# Patient Record
Sex: Female | Born: 1975 | Race: White | Hispanic: No | Marital: Single | Smoking: Former smoker
Health system: Southern US, Community
[De-identification: ages and names within clinical notes are randomized; demographics above are authoritative.]

## PROBLEM LIST (undated history)

## (undated) DIAGNOSIS — E119 Type 2 diabetes mellitus without complications: Secondary | ICD-10-CM

## (undated) DIAGNOSIS — I10 Essential (primary) hypertension: Secondary | ICD-10-CM

## (undated) DIAGNOSIS — Z8719 Personal history of other diseases of the digestive system: Secondary | ICD-10-CM

## (undated) HISTORY — PX: COLONOSCOPY: SHX174

---

## 2010-12-25 ENCOUNTER — Ambulatory Visit: Payer: Self-pay

## 2014-09-20 ENCOUNTER — Other Ambulatory Visit: Payer: Self-pay

## 2014-09-20 ENCOUNTER — Encounter: Payer: Self-pay | Admitting: General Practice

## 2014-09-20 ENCOUNTER — Emergency Department
Admission: EM | Admit: 2014-09-20 | Discharge: 2014-09-20 | Disposition: A | Discharge status: Home / Self Care | Payer: Self-pay | Attending: Emergency Medicine | Admitting: Emergency Medicine

## 2014-09-20 ENCOUNTER — Ambulatory Visit
Admission: EM | Admit: 2014-09-20 | Discharge: 2014-09-20 | Payer: Self-pay | Attending: Internal Medicine | Admitting: Internal Medicine

## 2014-09-20 DIAGNOSIS — N764 Abscess of vulva: Secondary | ICD-10-CM | POA: Insufficient documentation

## 2014-09-20 DIAGNOSIS — R509 Fever, unspecified: Secondary | ICD-10-CM | POA: Insufficient documentation

## 2014-09-20 DIAGNOSIS — Z79899 Other long term (current) drug therapy: Secondary | ICD-10-CM | POA: Insufficient documentation

## 2014-09-20 DIAGNOSIS — L0291 Cutaneous abscess, unspecified: Secondary | ICD-10-CM

## 2014-09-20 DIAGNOSIS — N762 Acute vulvitis: Secondary | ICD-10-CM | POA: Insufficient documentation

## 2014-09-20 DIAGNOSIS — I10 Essential (primary) hypertension: Secondary | ICD-10-CM

## 2014-09-20 DIAGNOSIS — L0390 Cellulitis, unspecified: Secondary | ICD-10-CM

## 2014-09-20 DIAGNOSIS — Z87891 Personal history of nicotine dependence: Secondary | ICD-10-CM | POA: Insufficient documentation

## 2014-09-20 DIAGNOSIS — Z3202 Encounter for pregnancy test, result negative: Secondary | ICD-10-CM | POA: Insufficient documentation

## 2014-09-20 DIAGNOSIS — E1165 Type 2 diabetes mellitus with hyperglycemia: Secondary | ICD-10-CM

## 2014-09-20 LAB — CBC WITH DIFFERENTIAL/PLATELET
Basophils Absolute: 0.1 10*3/uL (ref 0–0.1)
Basophils Relative: 0 %
EOS ABS: 0.1 10*3/uL (ref 0–0.7)
Eosinophils Relative: 1 %
HCT: 46.9 % (ref 35.0–47.0)
Hemoglobin: 15.3 g/dL (ref 12.0–16.0)
Lymphocytes Relative: 11 %
Lymphs Abs: 1.7 10*3/uL (ref 1.0–3.6)
MCH: 30 pg (ref 26.0–34.0)
MCHC: 32.7 g/dL (ref 32.0–36.0)
MCV: 91.7 fL (ref 80.0–100.0)
Monocytes Absolute: 1 10*3/uL — ABNORMAL HIGH (ref 0.2–0.9)
Monocytes Relative: 6 %
NEUTROS ABS: 13.6 10*3/uL — AB (ref 1.4–6.5)
NEUTROS PCT: 82 %
Platelets: 287 10*3/uL (ref 150–440)
RBC: 5.12 MIL/uL (ref 3.80–5.20)
RDW: 12.5 % (ref 11.5–14.5)
WBC: 16.5 10*3/uL — ABNORMAL HIGH (ref 3.6–11.0)

## 2014-09-20 LAB — COMPREHENSIVE METABOLIC PANEL
ALK PHOS: 106 U/L (ref 38–126)
ALT: 31 U/L (ref 14–54)
AST: 20 U/L (ref 15–41)
Albumin: 4.2 g/dL (ref 3.5–5.0)
Anion gap: 11 (ref 5–15)
BUN: 10 mg/dL (ref 6–20)
CO2: 24 mmol/L (ref 22–32)
Calcium: 9 mg/dL (ref 8.9–10.3)
Chloride: 100 mmol/L — ABNORMAL LOW (ref 101–111)
Creatinine, Ser: 0.64 mg/dL (ref 0.44–1.00)
GFR calc Af Amer: >60 mL/min (ref >60)
GLUCOSE: 290 mg/dL — AB (ref 65–99)
Potassium: 3.4 mmol/L — ABNORMAL LOW (ref 3.5–5.1)
Sodium: 135 mmol/L (ref 135–145)
Total Bilirubin: 4.3 mg/dL — ABNORMAL HIGH (ref 0.3–1.2)
Total Protein: 7.8 g/dL (ref 6.5–8.1)

## 2014-09-20 LAB — URINALYSIS COMPLETE WITH MICROSCOPIC (ARMC ONLY)
Bilirubin Urine: NEGATIVE
Glucose, UA: >500 mg/dL — AB
Leukocytes, UA: NEGATIVE
NITRITE: NEGATIVE
Protein, ur: 100 mg/dL — AB
Specific Gravity, Urine: 1.035 — ABNORMAL HIGH (ref 1.005–1.030)
pH: 5 (ref 5.0–8.0)

## 2014-09-20 LAB — POCT PREGNANCY, URINE: PREG TEST UR: NEGATIVE

## 2014-09-20 LAB — LACTIC ACID, PLASMA: Lactic Acid, Venous: 1.6 mmol/L (ref 0.5–2.0)

## 2014-09-20 LAB — GLUCOSE, CAPILLARY: Glucose-Capillary: 219 mg/dL — ABNORMAL HIGH (ref 70–99)

## 2014-09-20 MED ORDER — CEPHALEXIN 500 MG PO CAPS
500.0000 mg | ORAL_CAPSULE | Freq: Once | ORAL | Status: AC
  Administered 2014-09-20: 500 mg via ORAL

## 2014-09-20 MED ORDER — CEPHALEXIN 500 MG PO CAPS
ORAL_CAPSULE | ORAL | Status: AC
  Filled 2014-09-20: qty 1

## 2014-09-20 MED ORDER — DOXYCYCLINE HYCLATE 50 MG PO CAPS: 100.0000 mg | ORAL_CAPSULE | Freq: Two times a day (BID) | ORAL | Status: DC

## 2014-09-20 MED ORDER — KETOROLAC TROMETHAMINE 60 MG/2ML IM SOLN
60.0000 mg | Freq: Once | INTRAMUSCULAR | Status: AC
  Administered 2014-09-20: 60 mg via INTRAMUSCULAR

## 2014-09-20 MED ORDER — METFORMIN HCL 500 MG PO TABS: 500.0000 mg | ORAL_TABLET | Freq: Two times a day (BID) | ORAL | Status: AC

## 2014-09-20 MED ORDER — DOXYCYCLINE HYCLATE 100 MG PO TABS
100.0000 mg | ORAL_TABLET | Freq: Once | ORAL | Status: AC
  Administered 2014-09-20: 100 mg via ORAL
  Filled 2014-09-20: qty 1

## 2014-09-20 MED ORDER — METFORMIN HCL 500 MG PO TABS: 500.0000 mg | ORAL_TABLET | Freq: Once | ORAL | Status: DC

## 2014-09-20 NOTE — ED Notes (Signed)
Pt discharged home after verbalizing understanding of discharge instructions; nad noted. 

## 2014-09-20 NOTE — ED Notes (Signed)
States stared Wednesday night with abscess left outer labia. Yesterday had sister attempt to lance using a needle. States "a lot of yellow stuff came out". Increased pain today and continues to drain bloody yellow discharge

## 2014-09-20 NOTE — Discharge Instructions (Signed)
As we discussed - please report directly to [facility] Room for evaluation and treatment  Abscess An abscess is an infected area that contains a collection of pus and debris.It can occur in almost any part of the body. An abscess is also known as a furuncle or boil. CAUSES  An abscess occurs when tissue gets infected. This can occur from blockage of oil or sweat glands, infection of hair follicles, or a minor injury to the skin. As the body tries to fight the infection, pus collects in the area and creates pressure under the skin. This pressure causes pain. People with weakened immune systems have difficulty fighting infections and get certain abscesses more often.  SYMPTOMS Usually an abscess develops on the skin and becomes a painful mass that is red, warm, and tender. If the abscess forms under the skin, you may feel a moveable soft area under the skin. Some abscesses break open (rupture) on their own, but most will continue to get worse without care. The infection can spread deeper into the body and eventually into the bloodstream, causing you to feel ill.  DIAGNOSIS  Your caregiver will take your medical history and perform a physical exam. A sample of fluid may also be taken from the abscess to determine what is causing your infection. TREATMENT  Your caregiver may prescribe antibiotic medicines to fight the infection. However, taking antibiotics alone usually does not cure an abscess. Your caregiver may need to make a small cut (incision) in the abscess to drain the pus. In some cases, gauze is packed into the abscess to reduce pain and to continue draining the area. HOME CARE INSTRUCTIONS   Only take over-the-counter or prescription medicines for pain, discomfort, or fever as directed by your caregiver.  If you were prescribed antibiotics, take them as directed. Finish them even if you start to feel better.  If gauze is used, follow your caregiver's directions for changing the  gauze.  To avoid spreading the infection:  Keep your draining abscess covered with a bandage.  Wash your hands well.  Do not share personal care items, towels, or whirlpools with others.  Avoid skin contact with others.  Keep your skin and clothes clean around the abscess.  Keep all follow-up appointments as directed by your caregiver. SEEK MEDICAL CARE IF:   You have increased pain, swelling, redness, fluid drainage, or bleeding.  You have muscle aches, chills, or a general ill feeling.  You have a fever. MAKE SURE YOU:   Understand these instructions.  Will watch your condition.  Will get help right away if you are not doing well or get worse. Document Released: 02/08/2005 Document Revised: 10/31/2011 Document Reviewed: 07/14/2011 [facility] Patient Information 2015 [place], [place]. This information is not intended to replace advice given to you by your health care provider. Make sure you discuss any questions you have with your health care provider.  Incision and Drainage Incision and drainage is a procedure in which a sac-like structure (cystic structure) is opened and drained. The area to be drained usually contains material such as pus, fluid, or blood.  LET YOUR CAREGIVER KNOW ABOUT:   Allergies to medicine.  Medicines taken, including vitamins, herbs, eyedrops, over-the-counter medicines, and creams.  Use of steroids (by mouth or creams).  Previous problems with anesthetics or numbing medicines.  History of bleeding problems or blood clots.  Previous surgery.  Other health problems, including diabetes and kidney problems.  Possibility of pregnancy, if this applies. RISKS AND COMPLICATIONS  Pain.  Bleeding.  Scarring.  Infection. BEFORE THE PROCEDURE  You may need to have an ultrasound or other imaging tests to see how large or deep your cystic structure is. Blood tests may also be used to determine if you have an infection or how severe the infection  is. You may need to have a tetanus shot. PROCEDURE  The affected area is cleaned with a cleaning fluid. The cyst area will then be numbed with a medicine (local anesthetic). A small incision will be made in the cystic structure. A syringe or catheter may be used to drain the contents of the cystic structure, or the contents may be squeezed out. The area will then be flushed with a cleansing solution. After cleansing the area, it is often gently packed with a gauze or another wound dressing. Once it is packed, it will be covered with gauze and tape or some other type of wound dressing. AFTER THE PROCEDURE   Often, you will be allowed to go home right after the procedure.  You may be given antibiotic medicine to prevent or heal an infection.  If the area was packed with gauze or some other wound dressing, you will likely need to come back in 1 to 2 days to get it removed.  The area should heal in about 14 days. Document Released: 10/25/2000 Document Revised: 10/31/2011 Document Reviewed: 06/26/2011 [facility] Patient Information 2015 [place], [place]. This information is not intended to replace advice given to you by your health care provider. Make sure you discuss any questions you have with your health care provider.

## 2014-09-20 NOTE — Discharge Instructions (Signed)

## 2014-09-20 NOTE — ED Notes (Signed)
Pt. Arrived to ed from urgent care. Pt was sent for a evaluation of abscess. Pt reports abscess to left labia that appeared on Wednesday. Pt vebalized experiencing a "yellow discharge".  Pt verbalized she has been experiencing fever over the last few days but denies fever today. Alert and Oriented.

## 2014-09-20 NOTE — ED Provider Notes (Signed)
[facility] Emergency Department Provider Note  ____________________________________________  Time seen: Approximately 1200 PM  I have reviewed the triage vital signs and the nursing notes.   HISTORY  Chief Complaint Abscess    HPI Tabitha Maynard is a 39 y.o. female without any medical problems who presents today with 5 days of left labial swelling and pain. The patient says that the area had been draining for the last 3 days with a large amount of yellow drainage. She also notes a fever yesterday but none today. She was seen in urgent care and sent to the emergency department for possible incision and drainage. She says that she has no known history of high blood pressure diabetes but has not been seen by a primary care doctor in 5 years. He is moderate and worsened by walking or sitting. She has no concern for sexually transmitted diseases.   History reviewed. No pertinent past medical history.  There are no active problems to display for this patient.   No past surgical history on file.  Current Outpatient Rx  Name  Route  Sig  Dispense  Refill  . loratadine (CLARITIN) 10 MG tablet   Oral   Take 10 mg by mouth daily.           Allergies Sulfa antibiotics  Family History  Problem Relation Age of Onset  . Heart attack Father     Social History History  Substance Use Topics  . Smoking status: Former Smoker    Types: Cigarettes  . Smokeless tobacco: Not on file  . Alcohol Use: No    Review of Systems Constitutional: Positive for fever  Eyes: No visual changes. ENT: No sore throat. Cardiovascular: Denies chest pain. Respiratory: Denies shortness of breath. Gastrointestinal: No abdominal pain.  No nausea, no vomiting.  No diarrhea.  No constipation. Genitourinary: Negative for dysuria. Musculoskeletal: Negative for back pain. Skin: Left labial swelling Neurological: Negative for headaches, focal weakness or numbness.  10-point  ROS otherwise negative.  ____________________________________________   PHYSICAL EXAM:  VITAL SIGNS: ED Triage Vitals  Enc Vitals Group     BP 09/20/14 1034 172/106 mmHg     Pulse Rate 09/20/14 1034 109     Resp 09/20/14 1034 19     Temp 09/20/14 1034 98.2 F (36.8 C)     Temp Source 09/20/14 1034 Oral     SpO2 09/20/14 1034 100 %     Weight 09/20/14 1032 262 lb (118.842 kg)     Height 09/20/14 1032 5\' 2"  (1.575 m)     Head Cir --      Peak Flow --      Pain Score 09/20/14 1032 7     Pain Loc --      Pain Edu? --      Excl. in GC? --     Constitutional: Alert and oriented. Well appearing and in no acute distress. Eyes: Conjunctivae are normal. PERRL. EOMI. Head: Atraumatic. Nose: No congestion/rhinnorhea. Mouth/Throat: Mucous membranes are moist.  Oropharynx non-erythematous. Neck: No stridor.   Cardiovascular: Tachycardic, regular rhythm. Grossly normal heart sounds.  Good peripheral circulation. Respiratory: Normal respiratory effort.  No retractions. Lungs CTAB. Gastrointestinal: Soft and nontender. No distention. No abdominal bruits. No CVA tenderness. Genitourinary:  Left labia swollen with erythema. There is a mild-to-moderate amount of induration throughout the labia. About two thirds posterior on the labia there is a 1 x 2 cm opening without any drainage. No pus could be expressed. There is  no fluctuance. Mild to moderate tenderness to palpation on the exam.  Musculoskeletal: No lower extremity tenderness nor edema.  No joint effusions. Neurologic:  Normal speech and language. No gross focal neurologic deficits are appreciated. Speech is normal. No gait instability. Skin:  Skin is warm, dry and intact. No rash noted. Psychiatric: Mood and affect are normal. Speech and behavior are normal.  ____________________________________________   LABS (all labs ordered are listed, but only abnormal results are displayed)  Labs Reviewed  COMPREHENSIVE METABOLIC PANEL -  Abnormal; Notable for the following:    Potassium 3.4 (*)    Chloride 100 (*)    Glucose, Bld 290 (*)    Total Bilirubin 4.3 (*)    All other components within normal limits  CBC WITH DIFFERENTIAL/PLATELET - Abnormal; Notable for the following:    WBC 16.5 (*)    Neutro Abs 13.6 (*)    Monocytes Absolute 1.0 (*)    All other components within normal limits  URINALYSIS COMPLETEWITH MICROSCOPIC ([facility])  - Abnormal; Notable for the following:    Color, Urine YELLOW (*)    APPearance HAZY (*)    Glucose, UA >500 (*)    Ketones, ur 2+ (*)    Specific Gravity, Urine 1.035 (*)    Hgb urine dipstick 2+ (*)    Protein, ur 100 (*)    Bacteria, UA RARE (*)    Squamous Epithelial / LPF 6-30 (*)    All other components within normal limits  CULTURE, BLOOD (ROUTINE X 2)  CULTURE, BLOOD (ROUTINE X 2)  URINE CULTURE  LACTIC ACID, PLASMA  LACTIC ACID, PLASMA  POC URINE PREG, ED  POCT PREGNANCY, URINE   ____________________________________________  EKG   Date: 09/20/2014  Rate: 111  Rhythm: Sinus tachycardia  QRS Axis: normal  Intervals: normal  ST/T Wave abnormalities: normal  Conduction Disutrbances: none  Narrative Interpretation: Sinus tachycardia   ____________________________________________  RADIOLOGY  ____________________________________________   PROCEDURES    ____________________________________________   INITIAL IMPRESSION / ASSESSMENT AND PLAN / ED COURSE  Pertinent labs & imaging results that were available during my care of the patient were reviewed by me and considered in my medical decision making (see chart for details).  Patiently with new onset diabetes complicating her cellulitis. She will be given fluids in the emergency department a first dose of antibiotics.  Her glucose will need to be rechecked and she'll need to be discharged with diabetes medication. We'll trend her blood pressure but may need antihypertensive medication as well.  Point of  care ultrasound done at the bedside without any fluid pocket.  ----------------------------------------- 3:09 PM on 09/20/2014 -----------------------------------------  Patient reassessed and heart rate at 97. Blood sugar down to 219. Urinalysis likely contaminated. Patient is not having any dysuria. Will not treat for UTI. We'll give antibiotics for cellulitis. Discussed with patient the importance of following up with primary care doctor for recheck of her blood pressure. She will also need a recheck of her sugar. I will discharge her on metformin. We also discussed the importance of following up because of the early death of her father from heart disease in his 6740s. The patient understands the diabetes and hypertension are risk factors for heart disease. ____________________________________________   FINAL CLINICAL IMPRESSION(S) / ED DIAGNOSES  Acute left labial abscess. New-onset diabetes. Hypertension. Initial visit.    Arelia Longestavid M Takai Chiaramonte, MD 09/20/14 [phone]

## 2014-09-20 NOTE — ED Provider Notes (Signed)
CSN: [id number]     Arrival date & time 09/20/14  0806 History   First MD Initiated Contact with Patient 09/20/14 [phone]     Chief Complaint  Patient presents with  . Abscess   (Consider location/radiation/quality/duration/timing/severity/associated sxs/prior Treatment) Patient is a 39 y.o. female presenting with abscess.  Abscess Associated symptoms: fever   Reports onset 5 days ago- enlarging left labial  abcess- fever /pain- sister tried to open it last night with a needle and drained a reported moderate amount of pus but it continues to enlarge. Fever 102 treated with ibuprofen. No previous medical history- no insurance and has no medical care.  LMP 1 week ago, denies activity.   History reviewed. No pertinent past medical history. History reviewed. No pertinent past surgical history. Family History  Problem Relation Age of Onset  . Heart attack Father    History  Substance Use Topics  . Smoking status: Former Games developermoker  . Smokeless tobacco: Not on file  . Alcohol Use: No   OB History    No data available     Review of Systems  Constitutional: Positive for fever.  HENT: Negative.   Eyes: Negative.   Respiratory: Negative.   Cardiovascular:       Tachycardia  Gastrointestinal: Negative.   Endocrine: Negative.   Genitourinary:       Large left labial swelling with drainage  Musculoskeletal: Negative.   Skin: Negative.   Allergic/Immunologic: Negative.   Neurological: Negative.   Hematological: Negative.   Psychiatric/Behavioral: Negative.     Allergies  Sulfa antibiotics  Home Medications   Prior to Admission medications   Medication Sig Start Date End Date Taking? Authorizing Provider  loratadine (CLARITIN) 10 MG tablet Take 10 mg by mouth daily.   Yes Historical Provider, MD   BP 173/137 mmHg  Pulse 136  Temp(Src) 98.3 F (36.8 C) (Tympanic)  Resp 18  Ht 5\' 2"  (1.575 m)  Wt 262 lb (118.842 kg)  BMI 47.91 kg/m2  SpO2 99%  LMP 09/10/2014  (Approximate) Physical Exam  Constitutional: She is cooperative. No distress.  HENT:  Head: Atraumatic.  Eyes:  conjugate gaze  Neck: Neck supple.  Cardiovascular: Regular rhythm.  Tachycardia present.   Pulmonary/Chest: Breath sounds normal. No respiratory distress.  Abdominal: Soft.  obese  Genitourinary: Pelvic exam was performed with patient supine. There is tenderness and lesion on the left labia.  Large left labial abcess- 2-3 x 5 +cm- evidence of superficial drainage- large abcess persists-- no identifiable recto-vaginal tracking- bartholians gland cannot be well exposed but palpates as labial specific. Indurated erythematous- enlarging    ED Course  Procedures (including critical care time) Labs Review Labs Reviewed - No data to display  Imaging Review No results found.   Toradol 60 mg IM given  Reported 102 this AM, treated with tylenol- afebrile on arrival-  pulse  136  BP 173/137   No  medical care   Consulted with  Dr Valentino Saxonherry Rebecca Eaton/[facility] who [facility] by ER/Gyn staff for  Disposition No results found for this or any previous visit. is communicated to the patient and she agrees. Private transport personal vehicle. MDM   1. Labial abscess        Rae HalstedLaurie W Lee, PA-C 09/20/14 1004

## 2014-09-21 LAB — URINE CULTURE: Culture: 3000

## 2014-09-25 LAB — CULTURE, BLOOD (ROUTINE X 2)
CULTURE: NO GROWTH
Culture: NO GROWTH

## 2014-12-05 ENCOUNTER — Ambulatory Visit
Admission: EM | Admit: 2014-12-05 | Discharge: 2014-12-05 | Disposition: A | Discharge status: Home / Self Care | Payer: Self-pay | Attending: Emergency Medicine | Admitting: Emergency Medicine

## 2014-12-05 DIAGNOSIS — B3749 Other urogenital candidiasis: Secondary | ICD-10-CM

## 2014-12-05 HISTORY — DX: Type 2 diabetes mellitus without complications: E11.9

## 2014-12-05 HISTORY — DX: Essential (primary) hypertension: I10

## 2014-12-05 MED ORDER — CLOTRIMAZOLE 1 % EX CREA: TOPICAL_CREAM | CUTANEOUS | Status: DC

## 2014-12-05 MED ORDER — FLUCONAZOLE 200 MG PO TABS: 200.0000 mg | ORAL_TABLET | Freq: Every day | ORAL | Status: AC

## 2014-12-05 NOTE — ED Notes (Signed)
Patient with cellulitis

## 2014-12-05 NOTE — Discharge Instructions (Signed)

## 2014-12-05 NOTE — ED Provider Notes (Signed)
CSN: [id number]     Arrival date & time 12/05/14  1441 History   First MD Initiated Contact with Patient 12/05/14 1538     Chief Complaint  Patient presents with  . Cellulitis   (Consider location/radiation/quality/duration/timing/severity/associated sxs/prior Treatment) HPI  This a 39 year old female who presents today a" draining abscess" near her labia that began on Thursday 2 days ago. Yesterday she began to run a fever and feel achy and decided to come in for treatment and evaluation. She states that the lesion is more red and seems to be in her groin crease more than her labia. Is currently on her period and she states that on the sanitary napkins noticed some drainage from that area. There is no  vaginal discharge. In May she had a labial abscess that she was placed on doxycycline and it all resolved; this is a new lesion on the same side but in a different spot. She does not shave her pubic region. She is not afebrile upon presentation today.  Past Medical History  Diagnosis Date  . Hypertension   . Diabetes mellitus without complication    History reviewed. No pertinent past surgical history. Family History  Problem Relation Age of Onset  . Heart attack Father    History  Substance Use Topics  . Smoking status: Former Smoker    Types: Cigarettes  . Smokeless tobacco: Not on file  . Alcohol Use: No   OB History    No data available     Review of Systems  All other systems reviewed and are negative.   Allergies  Sulfa antibiotics  Home Medications   Prior to Admission medications   Medication Sig Start Date End Date Taking? Authorizing Provider  glipiZIDE (GLUCOTROL) 5 MG tablet Take by mouth daily before breakfast.   Yes Historical Provider, MD  losartan (COZAAR) 50 MG tablet Take 50 mg by mouth daily.   Yes Historical Provider, MD  clotrimazole (LOTRIMIN) 1 % cream Apply to affected area 2 times daily 12/05/14   Lutricia Feil, PA-C  doxycycline (VIBRAMYCIN) 50  MG capsule Take 2 capsules (100 mg total) by mouth 2 (two) times daily. 09/20/14   Myrna Blazer, MD  fluconazole (DIFLUCAN) 200 MG tablet Take 1 tablet (200 mg total) by mouth daily. 12/05/14 12/12/14  Lutricia Feil, PA-C  loratadine (CLARITIN) 10 MG tablet Take 10 mg by mouth daily.    Historical Provider, MD  metFORMIN (GLUCOPHAGE) 500 MG tablet Take 1 tablet (500 mg total) by mouth 2 (two) times daily with a meal. 09/20/14 09/20/15  Myrna Blazer, MD   BP 158/101 mmHg  Pulse 111  Temp(Src) 98.3 F (36.8 C) (Tympanic)  Resp 20  Ht 5\' 2"  (1.575 m)  Wt 262 lb (118.842 kg)  BMI 47.91 kg/m2  SpO2 99%  LMP 12/05/2014 Physical Exam  Constitutional: She is oriented to person, place, and time. She appears well-developed and well-nourished.  HENT:  Head: Normocephalic and atraumatic.  Eyes: Pupils are equal, round, and reactive to light.  Genitourinary:  Examination was performed in the presence at [facility]. Left iintertriginous area is beefy red with exudate and expanding border with satellite lesions present. There is more ulcerated area just in the inferior portion.no nduration or fluctuance present. It does not involve the vaginal area.  Neurological: She is alert and oriented to person, place, and time.  Skin: Rash noted. There is erythema.  Psychiatric: She has a normal mood and affect. Her behavior is normal.  Judgment and thought content normal.    ED Course  Procedures (including critical care time) Labs Review Labs Reviewed - No data to display  Imaging Review No results found.   MDM   1. Candida infection of genital region    New Prescriptions   CLOTRIMAZOLE (LOTRIMIN) 1 % CREAM    Apply to affected area 2 times daily   FLUCONAZOLE (DIFLUCAN) 200 MG TABLET    Take 1 tablet (200 mg total) by mouth daily.  Plan: 1. Diagnosis reviewed with patient 2. rx as per orders; risks, benefits, potential side effects reviewed with patient 3. Recommend supportive  treatment with loose clothing,keep dry 4. F/u prn if symptoms worsen or don't improve with PCP     Lutricia Feil, PA-C 12/05/14 1702

## 2017-02-20 ENCOUNTER — Other Ambulatory Visit: Payer: Self-pay | Admitting: Internal Medicine

## 2017-02-20 DIAGNOSIS — Z1231 Encounter for screening mammogram for malignant neoplasm of breast: Secondary | ICD-10-CM

## 2017-03-07 ENCOUNTER — Ambulatory Visit
Admission: RE | Admit: 2017-03-07 | Discharge: 2017-03-07 | Disposition: A | Discharge status: Home / Self Care | Payer: 59 | Source: Ambulatory Visit | Attending: Internal Medicine | Admitting: Internal Medicine

## 2017-03-07 DIAGNOSIS — Z1231 Encounter for screening mammogram for malignant neoplasm of breast: Secondary | ICD-10-CM

## 2018-01-30 ENCOUNTER — Other Ambulatory Visit: Payer: Self-pay | Admitting: Internal Medicine

## 2018-01-30 DIAGNOSIS — Z1231 Encounter for screening mammogram for malignant neoplasm of breast: Secondary | ICD-10-CM

## 2018-05-21 ENCOUNTER — Ambulatory Visit
Admission: RE | Admit: 2018-05-21 | Discharge: 2018-05-21 | Disposition: A | Discharge status: Home / Self Care | Payer: Managed Care, Other (non HMO) | Source: Ambulatory Visit | Attending: Internal Medicine | Admitting: Internal Medicine

## 2018-05-21 DIAGNOSIS — Z1231 Encounter for screening mammogram for malignant neoplasm of breast: Secondary | ICD-10-CM | POA: Insufficient documentation

## 2019-02-25 ENCOUNTER — Ambulatory Visit
Admission: EM | Admit: 2019-02-25 | Discharge: 2019-02-25 | Disposition: A | Discharge status: Home / Self Care | Payer: Managed Care, Other (non HMO)

## 2019-02-25 ENCOUNTER — Other Ambulatory Visit: Payer: Self-pay

## 2019-02-25 ENCOUNTER — Encounter: Payer: Self-pay | Admitting: Emergency Medicine

## 2019-02-25 DIAGNOSIS — M62830 Muscle spasm of back: Secondary | ICD-10-CM

## 2019-02-25 DIAGNOSIS — S39012A Strain of muscle, fascia and tendon of lower back, initial encounter: Secondary | ICD-10-CM

## 2019-02-25 MED ORDER — NAPROXEN 500 MG PO TABS: 500.0000 mg | ORAL_TABLET | Freq: Two times a day (BID) | ORAL | 0 refills | Status: DC

## 2019-02-25 MED ORDER — CYCLOBENZAPRINE HCL 10 MG PO TABS: 10.0000 mg | ORAL_TABLET | Freq: Two times a day (BID) | ORAL | 0 refills | Status: DC | PRN

## 2019-02-25 NOTE — Discharge Instructions (Signed)
It was very nice seeing you today in clinic. Thank you for entrusting me with your care.   As discussed, your pain seems to be musculoskeletal in nature. Plans for treating you are as follows:  Please utilize the medications that we discussed. Your prescriptions have been called in to your pharmacy.  Avoid overdoing it, but you need to make efforts to remain active as tolerated.  Avoiding activity all together can make your pain worse. You may find that applying moist heat application will help with your pain.  Heat should be applied for 10-15 minutes at a time at least 3-4 times a day.  Make arrangements to follow up with your regular doctor in 1 week for re-evaluation. If your symptoms/condition worsens, please seek follow up care either here or in the ER. Please remember, our [place] providers are "right here with you" when you need [place].   Again, it was my pleasure to take care of you today. Thank you for choosing our clinic. I hope that you start to feel better quickly.   Honor Loh, MSN, APRN, FNP-C, CEN Advanced Practice Provider [facility]

## 2019-02-25 NOTE — ED Provider Notes (Signed)
[place], [place]   Name: Tabitha Maynard DOB: April 17, 1976 MRN: [id number] CSN: [id number] PCP: Tracie Harrier, MD  Arrival date and time:  02/25/19 1330  Chief Complaint:  Back Pain (APPT) and Hip Pain   NOTE: Prior to seeing the patient today, I have reviewed the triage nursing documentation and vital signs. Clinical staff has updated patient's PMH/PSHx, current medication list, and drug allergies/intolerances to ensure comprehensive history available to assist in medical decision making.   History:   HPI: Tabitha Maynard is a 43 y.o. female who presents today with complaints of lower back pain and BILATERAL hip pain that began with acute onset yesterday.  Patient reports that she was participating in a "boot camp" style workout at [employer] when the pain began.  States I think I lifted 1 of the bars during the workout wrong.  I have been hurting ever since". Patient denies radiation of her pain into her lower extremities. She has not appreciated any weakness or distal paraesthesias in her legs. Patient denies any acute issues with bowel or bladder incontinence. No saddle anesthesia. Patient notes that pain is exacerbated by torso twisting, bending, and lifting "just about anything".. She has attempted conservative management at home using ibuprofen, in addition to applying heat/ice, which has done little to improve her symptoms. PMH does not include any previous back injuries or surgeries.  Past Medical History:  Diagnosis Date   Diabetes mellitus without complication ([facility])    Hypertension     History reviewed. No pertinent surgical history.  Family History  Problem Relation Age of Onset   Hyperlipidemia Mother    Heart attack Father 65   Diabetes Father     Social History   Tobacco Use   Smoking status: Former Smoker    Types: Cigarettes    Quit date: 02/24/2009    Years since quitting: 10.0   Smokeless tobacco: Never Used  Substance Use Topics    Alcohol use: No   Drug use: No    There are no active problems to display for this patient.   Home Medications:    Current Meds  Medication Sig   glipiZIDE (GLUCOTROL) 5 MG tablet Take by mouth daily before breakfast.   loratadine (CLARITIN) 10 MG tablet Take 10 mg by mouth daily.   losartan (COZAAR) 50 MG tablet Take 50 mg by mouth daily.   medroxyPROGESTERone (PROVERA) 10 MG tablet TAKE 1 TABLET BY MOUTH ONCE DAILY   metFORMIN (GLUCOPHAGE) 500 MG tablet Take 1 tablet (500 mg total) by mouth 2 (two) times daily with a meal.    Allergies:   Sulfa antibiotics  Review of Systems (ROS): Review of Systems  Constitutional: Negative for chills and fever.  HENT: Negative for sore throat.   Eyes: Negative for pain and visual disturbance.  Respiratory: Negative for cough and shortness of breath.   Cardiovascular: Negative for chest pain and palpitations.  Gastrointestinal: Negative for abdominal pain, diarrhea, nausea and vomiting.  Genitourinary: Negative for dysuria, frequency, hematuria, pelvic pain, urgency, vaginal bleeding and vaginal discharge.  Musculoskeletal: Positive for back pain.       BILATERAL hip pain  Skin: Negative for color change and rash.  Neurological: Negative for weakness and numbness.  Psychiatric/Behavioral: Positive for sleep disturbance (2/2 acute pain).  All other systems reviewed and are negative.    Vital Signs: Today's Vitals   02/25/19 1425 02/25/19 1426 02/25/19 1447  BP:  (!) 146/74   Pulse:  65   Resp:  18  Temp:  98 F (36.7 C)   TempSrc:  Oral   SpO2:  100%   Weight:  230 lb (104.3 kg)   Height:  5\' 2"  (1.575 m)   PainSc: 8   9     Physical Exam: Physical Exam  Constitutional: She is oriented to person, place, and time and well-developed, well-nourished, and in no distress. No distress.  HENT:  Head: Normocephalic and atraumatic.  Nose: Nose normal.  Mouth/Throat: Oropharynx is clear and moist.  Eyes: Pupils are equal,  round, and reactive to light.  Neck: Normal range of motion and full passive range of motion without pain. Neck supple.  Cardiovascular: Normal rate, regular rhythm, normal heart sounds and intact distal pulses. Exam reveals no gallop and no friction rub.  No murmur heard. Pulmonary/Chest: Effort normal. No respiratory distress. She has no decreased breath sounds. She has no wheezes. She has no rhonchi. She has no rales.  Musculoskeletal: Normal range of motion.     Right hip: She exhibits tenderness. She exhibits normal range of motion, normal strength, no swelling, no crepitus and no deformity.     Left hip: She exhibits tenderness. She exhibits normal range of motion, normal strength, no bony tenderness, no swelling, no crepitus and no deformity.     Lumbar back: She exhibits tenderness (BILATERAL paralumbar muscle groups and overlying BILATERAL SI joints) and spasm. She exhibits no swelling and no deformity.     Comments: No midline pain or gross deformities noted lumbosacral spine.  Neurological: She is alert and oriented to person, place, and time. Gait normal.  Skin: Skin is warm and dry. No rash noted. She is not diaphoretic.  Psychiatric: Mood, memory, affect and judgment normal.  Nursing note and vitals reviewed.   Urgent Care Treatments / Results:   LABS: PLEASE NOTE: all labs that were ordered this encounter are listed, however only abnormal results are displayed. Labs Reviewed - No data to display  EKG: -None  RADIOLOGY: No results found.  PROCEDURES: Procedures  MEDICATIONS RECEIVED THIS VISIT: Medications - No data to display  PERTINENT CLINICAL COURSE NOTES/UPDATES:   Initial Impression / Assessment and Plan / Urgent Care Course:  Pertinent labs & imaging results that were available during my care of the patient were personally reviewed by me and considered in my medical decision making (see lab/imaging section of note for values and interpretations).  Tabitha Maynard is a 43 y.o. female who presents to [facility] today with complaints of back and hip pain following "boot camp" workout.   Patient is well appearing overall in clinic today. She does not appear to be in any acute distress. Presenting symptoms (see HPI) and exam as documented above.  Exam reassuring.  Patient denies feeling or hearing any sort of pop with her physical activity yesterday.  She denies difficulty with ambulation.  No extremity weakness or distal paresthesias.  Pain does not radiate into the posterior thigh.  Discussed utility of diagnostic plain films felt to be of little added benefit given the nature of her injury.  Patient is overweight and recently started boot camp (high intensity workout). Core muscle groups not felt to be strong/conditioned; working with [facility].  Suspect lumbosacral strain secondary to high intensity physical activity and weightlifting. Will pursue treatment using anti-inflammatory (naprosyn) medication and skeletal muscle relaxer (cyclobenzaprine). She was educated on complimentary modalities to help with her pain. Patient encouraged to rest and avoid twisting/bending/lifting. She will likely find added benefit of applying  heat and/or ice TID for at least 10-15 minutes at a time; written information provided on today's AVS. Patient was advised that she cannot participate in workouts at the gym until her back is completely healed, as doing so could cause significant injury. Patient verbalized understanding.  Discussed follow up with primary care physician in 1 week for re-evaluation. If pain persists/worsens, or if she develops any numbness/weakness on bowel/bladder incontinence, she will need to be seen sooner in the emergency department for advanced CT/MRI imaging. I have reviewed the follow up and strict return precautions for any new or worsening symptoms. Patient verbalizes that she is aware of symptoms that would be deemed urgent/emergent, and  would thus require further evaluation in the emergency department. At the time of discharge, she verbalized understanding and consent with the discharge plan as it was reviewed with her. All questions were fielded by provider and/or clinic staff prior to patient discharge.    Final Clinical Impressions / Urgent Care Diagnoses:   Final diagnoses:  Strain of lumbar region, initial encounter  Spasm of muscle of lower back    New Prescriptions:  [place] Controlled Substance Registry consulted? Not Applicable  Meds ordered this encounter  Medications   naproxen (NAPROSYN) 500 MG tablet    Sig: Take 1 tablet (500 mg total) by mouth 2 (two) times daily.    Dispense:  30 tablet    Refill:  0   cyclobenzaprine (FLEXERIL) 10 MG tablet    Sig: Take 1 tablet (10 mg total) by mouth 2 (two) times daily as needed for muscle spasms.    Dispense:  20 tablet    Refill:  0    Recommended Follow up Care:  Patient encouraged to follow up with the following provider within the specified time frame, or sooner as dictated by the severity of her symptoms. As always, she was instructed that for any urgent/emergent care needs, she should seek care either here or in the emergency department for more immediate evaluation.  Follow-up Information    Barbette ReichmannHande, Vishwanath, MD In 1 week.   Specialty: Internal Medicine Contact information: [address] [facility] [place] [id number] [phone]         NOTE: This note was prepared using Dragon dictation software along with smaller phrase technology. Despite my best ability to proofread, there is the potential that transcriptional errors may still occur from this process, and are completely unintentional.    Verlee MonteGray, Marquay Kruse E, NP 02/26/19 1720

## 2019-02-25 NOTE — ED Triage Notes (Signed)
Patient in today c/o lower back pain and bilateral hip pain since yesterday. Patient has been doing a Landscape [employer] camp" exercise program. Patient has used heat/ice and Ibuprofen/Aleve without relief.

## 2019-06-03 ENCOUNTER — Other Ambulatory Visit: Payer: Self-pay | Admitting: Internal Medicine

## 2019-06-03 DIAGNOSIS — Z1231 Encounter for screening mammogram for malignant neoplasm of breast: Secondary | ICD-10-CM

## 2019-06-04 ENCOUNTER — Ambulatory Visit
Admission: RE | Admit: 2019-06-04 | Discharge: 2019-06-04 | Disposition: A | Discharge status: Home / Self Care | Payer: Managed Care, Other (non HMO) | Source: Ambulatory Visit | Attending: Internal Medicine | Admitting: Internal Medicine

## 2019-06-04 DIAGNOSIS — Z1231 Encounter for screening mammogram for malignant neoplasm of breast: Secondary | ICD-10-CM | POA: Insufficient documentation

## 2020-01-20 ENCOUNTER — Other Ambulatory Visit: Payer: Self-pay | Admitting: Internal Medicine

## 2020-01-20 ENCOUNTER — Ambulatory Visit (HOSPITAL_COMMUNITY)
Admission: RE | Admit: 2020-01-20 | Discharge: 2020-01-20 | Disposition: A | Discharge status: Home / Self Care | Payer: Managed Care, Other (non HMO) | Source: Ambulatory Visit | Attending: Pulmonary Disease | Admitting: Pulmonary Disease

## 2020-01-20 DIAGNOSIS — U071 COVID-19: Secondary | ICD-10-CM

## 2020-01-20 DIAGNOSIS — E119 Type 2 diabetes mellitus without complications: Secondary | ICD-10-CM

## 2020-01-20 DIAGNOSIS — I10 Essential (primary) hypertension: Secondary | ICD-10-CM

## 2020-01-20 MED ORDER — EPINEPHRINE 0.3 MG/0.3ML IJ SOAJ: 0.3000 mg | Freq: Once | INTRAMUSCULAR | Status: DC | PRN

## 2020-01-20 MED ORDER — FAMOTIDINE IN NACL 20-0.9 MG/50ML-% IV SOLN: 20.0000 mg | Freq: Once | INTRAVENOUS | Status: DC | PRN

## 2020-01-20 MED ORDER — DIPHENHYDRAMINE HCL 50 MG/ML IJ SOLN: 50.0000 mg | Freq: Once | INTRAMUSCULAR | Status: DC | PRN

## 2020-01-20 MED ORDER — ALBUTEROL SULFATE HFA 108 (90 BASE) MCG/ACT IN AERS: 2.0000 | INHALATION_SPRAY | Freq: Once | RESPIRATORY_TRACT | Status: DC | PRN

## 2020-01-20 MED ORDER — SODIUM CHLORIDE 0.9 % IV SOLN
1200.0000 mg | Freq: Once | INTRAVENOUS | Status: AC
  Administered 2020-01-20: 1200 mg via INTRAVENOUS
  Filled 2020-01-20: qty 10

## 2020-01-20 MED ORDER — SODIUM CHLORIDE 0.9 % IV SOLN: INTRAVENOUS | Status: DC | PRN

## 2020-01-20 MED ORDER — METHYLPREDNISOLONE SODIUM SUCC 125 MG IJ SOLR: 125.0000 mg | Freq: Once | INTRAMUSCULAR | Status: DC | PRN

## 2020-01-20 NOTE — Discharge Instructions (Signed)

## 2020-01-20 NOTE — Progress Notes (Signed)
  Diagnosis: COVID-19  Physician:DR. Delford Field  Procedure: [facility] Med: casirivimab\imdevimab infusion - Provided patient with casirivimab\imdevimab fact sheet for patients, parents and caregivers prior to infusion.  Complications: No immediate complications noted.  Discharge: Discharged home   Tabitha Maynard 01/20/2020

## 2020-01-20 NOTE — Progress Notes (Signed)
I connected by phone with Tabitha Maynard on 01/20/2020 at 11:08 AM to discuss the potential use of a new treatment for mild to moderate COVID-19 viral infection in non-hospitalized patients.  This patient is a 44 y.o. female that meets the FDA criteria for Emergency Use Authorization of COVID monoclonal antibody casirivimab/imdevimab.  Has a (+) direct SARS-CoV-2 viral test result  Has mild or moderate COVID-19   Is NOT hospitalized due to COVID-19  Is within 10 days of symptom onset  Has at least one of the high risk factor(s) for progression to severe COVID-19 and/or hospitalization as defined in EUA.  Specific high risk criteria : Diabetes   I have spoken and communicated the following to the patient or parent/caregiver regarding COVID monoclonal antibody treatment:  1. FDA has authorized the emergency use for the treatment of mild to moderate COVID-19 in adults and pediatric patients with positive results of direct SARS-CoV-2 viral testing who are 7 years of age and older weighing at least 40 kg, and who are at high risk for progressing to severe COVID-19 and/or hospitalization.  2. The significant known and potential risks and benefits of COVID monoclonal antibody, and the extent to which such potential risks and benefits are unknown.  3. Information on available alternative treatments and the risks and benefits of those alternatives, including clinical trials.  4. Patients treated with COVID monoclonal antibody should continue to self-isolate and use infection control measures (e.g., wear mask, isolate, social distance, avoid sharing personal items, clean and disinfect "high touch" surfaces, and frequent handwashing) according to CDC guidelines.   5. The patient or parent/caregiver has the option to accept or refuse COVID monoclonal antibody treatment.  After reviewing this information with the patient, The patient agreed to proceed with receiving casirivimab\imdevimab infusion  and will be provided a copy of the Fact sheet prior to receiving the infusion.   Patient considered high risk with history of DM2 and hypertension.  Positive Covid test on 9/2 with symptoms beginning shortly thereafter.  Mab infusion on 9/7 at 1330.   Marcy Salvo, NP 01/20/2020 11:08 AM

## 2020-06-08 ENCOUNTER — Other Ambulatory Visit: Payer: Self-pay | Admitting: Internal Medicine

## 2020-06-08 DIAGNOSIS — Z1231 Encounter for screening mammogram for malignant neoplasm of breast: Secondary | ICD-10-CM

## 2020-06-22 ENCOUNTER — Ambulatory Visit
Admission: RE | Admit: 2020-06-22 | Discharge: 2020-06-22 | Disposition: A | Discharge status: Home / Self Care | Payer: Managed Care, Other (non HMO) | Source: Ambulatory Visit | Attending: Internal Medicine | Admitting: Internal Medicine

## 2020-06-22 ENCOUNTER — Other Ambulatory Visit: Payer: Self-pay

## 2020-06-22 DIAGNOSIS — Z1231 Encounter for screening mammogram for malignant neoplasm of breast: Secondary | ICD-10-CM | POA: Diagnosis not present

## 2021-01-24 ENCOUNTER — Other Ambulatory Visit: Payer: Self-pay | Admitting: Surgery

## 2021-02-04 ENCOUNTER — Encounter
Admission: RE | Admit: 2021-02-04 | Discharge: 2021-02-04 | Disposition: A | Discharge status: Home / Self Care | Payer: Managed Care, Other (non HMO) | Source: Ambulatory Visit | Attending: Surgery | Admitting: Surgery

## 2021-02-04 ENCOUNTER — Other Ambulatory Visit: Payer: Self-pay

## 2021-02-04 DIAGNOSIS — Z01818 Encounter for other preprocedural examination: Secondary | ICD-10-CM | POA: Insufficient documentation

## 2021-02-04 DIAGNOSIS — E118 Type 2 diabetes mellitus with unspecified complications: Secondary | ICD-10-CM | POA: Insufficient documentation

## 2021-02-04 DIAGNOSIS — I10 Essential (primary) hypertension: Secondary | ICD-10-CM | POA: Diagnosis not present

## 2021-02-04 HISTORY — DX: Personal history of other diseases of the digestive system: Z87.19

## 2021-02-04 HISTORY — DX: Type 2 diabetes mellitus without complications: E11.9

## 2021-02-04 NOTE — Patient Instructions (Signed)
Your procedure is scheduled on:02-09-21 Wednesday Report to the Registration Desk on the 1st floor of the Medical Mall.Then proceed to the 2nd floor Surgery Desk in the Medical Mall To find out your arrival time, please call [phone] between 1PM - 3PM on:02-08-21 Tuesday  REMEMBER: Instructions that are not followed completely may result in serious medical risk, up to and including death; or upon the discretion of your surgeon and anesthesiologist your surgery may need to be rescheduled.  Do not eat food after midnight the night before surgery.  No gum chewing, lozengers or hard candies.  You may however, drink water up to 2 hours before you are scheduled to arrive for your surgery. Do not drink anything within 2 hours of your scheduled arrival time.  Type 1 and Type 2 diabetics should only drink water.  TAKE THESE MEDICATIONS THE MORNING OF SURGERY WITH A SIP OF WATER: -atorvastatin (LIPITOR) 10 MG tablet -loratadine (CLARITIN) 10 MG table  Stop Metformin 2 days prior to surgery-Last dose on 02-06-21 Sunday  One week prior to surgery: Stop Anti-inflammatories (NSAIDS) such as Advil, Aleve, Ibuprofen, Motrin, Naproxen, Naprosyn and Aspirin based products such as Excedrin, Goodys Powder, BC Powder.You may however, take Tylenol if needed for pain up until the day of surgery.  Stop ANY OVER THE COUNTER supplements/vitamins NOW (02-04-21) until after surgery (Multivitamin)  No Alcohol for 24 hours before or after surgery.  No Smoking including e-cigarettes for 24 hours prior to surgery.  No chewable tobacco products for at least 6 hours prior to surgery.  No nicotine patches on the day of surgery.  Do not use any "recreational" drugs for at least a week prior to your surgery.  Please be advised that the combination of cocaine and anesthesia may have negative outcomes, up to and including death. If you test positive for cocaine, your surgery will be cancelled.  On the morning of  surgery brush your teeth with toothpaste and water, you may rinse your mouth with mouthwash if you wish. Do not swallow any toothpaste or mouthwash.  Use CHG Soap as directed on instruction sheet.  Do not wear jewelry, make-up, hairpins, clips or nail polish.  Do not wear lotions, powders, or perfumes.   Do not shave body from the neck down 48 hours prior to surgery just in case you cut yourself which could leave a site for infection.  Also, freshly shaved skin may become irritated if using the CHG soap.  Contact lenses, hearing aids and dentures may not be worn into surgery.  Do not bring valuables to the hospital. [facility] is not responsible for any missing/lost belongings or valuables.   Notify your doctor if there is any change in your medical condition (cold, fever, infection).  Wear comfortable clothing (specific to your surgery type) to the hospital.  After surgery, you can help prevent lung complications by doing breathing exercises.  Take deep breaths and cough every 1-2 hours. Your doctor may order a device called an Incentive Spirometer to help you take deep breaths. When coughing or sneezing, hold a pillow firmly against your incision with both hands. This is called "splinting." Doing this helps protect your incision. It also decreases belly discomfort.  If you are being admitted to the hospital overnight, leave your suitcase in the car. After surgery it may be brought to your room.  If you are being discharged the day of surgery, you will not be allowed to drive home. You will need a responsible adult (18  years or older) to drive you home and stay with you that night.   If you are taking public transportation, you will need to have a responsible adult (18 years or older) with you. Please confirm with your physician that it is acceptable to use public transportation.   Please call the Pre-admissions Testing Dept. at [phone] if you have any questions about these  instructions.  Surgery Visitation Policy:  Patients undergoing a surgery or procedure may have one family member or support person with them as long as that person is not COVID-19 positive or experiencing its symptoms.  That person may remain in the waiting area during the procedure and may rotate out with other people.  Inpatient Visitation:    Visiting hours are 7 a.m. to 8 p.m. Up to two visitors ages 16+ are allowed at one time in a patient room. The visitors may rotate out with other people during the day. Visitors must check out when they leave, or other visitors will not be allowed. One designated support person may remain overnight. The visitor must pass COVID-19 screenings, use hand sanitizer when entering and exiting the patient's room and wear a mask at all times, including in the patient's room. Patients must also wear a mask when staff or their visitor are in the room. Masking is required regardless of vaccination status.

## 2021-02-08 MED ORDER — CHLORHEXIDINE GLUCONATE 0.12 % MT SOLN: 15.0000 mL | Freq: Once | OROMUCOSAL | Status: AC

## 2021-02-08 MED ORDER — FAMOTIDINE 20 MG PO TABS: 20.0000 mg | ORAL_TABLET | Freq: Once | ORAL | Status: AC

## 2021-02-08 MED ORDER — CEFAZOLIN SODIUM-DEXTROSE 2-4 GM/100ML-% IV SOLN
2.0000 g | INTRAVENOUS | Status: AC
  Administered 2021-02-09: 2 g via INTRAVENOUS

## 2021-02-08 MED ORDER — SODIUM CHLORIDE 0.9 % IV SOLN: INTRAVENOUS | Status: DC

## 2021-02-08 MED ORDER — ORAL CARE MOUTH RINSE: 15.0000 mL | Freq: Once | OROMUCOSAL | Status: AC

## 2021-02-09 ENCOUNTER — Encounter: Payer: Self-pay | Admitting: Surgery

## 2021-02-09 ENCOUNTER — Other Ambulatory Visit: Payer: Self-pay

## 2021-02-09 ENCOUNTER — Ambulatory Visit: Payer: Managed Care, Other (non HMO) | Admitting: Urgent Care

## 2021-02-09 ENCOUNTER — Ambulatory Visit
Admission: RE | Admit: 2021-02-09 | Discharge: 2021-02-09 | Disposition: A | Discharge status: Home / Self Care | Payer: Managed Care, Other (non HMO) | Attending: Surgery | Admitting: Surgery

## 2021-02-09 ENCOUNTER — Encounter
Admission: RE | Disposition: A | Discharge status: Home / Self Care | Payer: Self-pay | Source: Home / Self Care | Attending: Surgery

## 2021-02-09 ENCOUNTER — Ambulatory Visit: Payer: Managed Care, Other (non HMO) | Admitting: Certified Registered"

## 2021-02-09 DIAGNOSIS — Z79899 Other long term (current) drug therapy: Secondary | ICD-10-CM | POA: Insufficient documentation

## 2021-02-09 DIAGNOSIS — G5601 Carpal tunnel syndrome, right upper limb: Secondary | ICD-10-CM | POA: Diagnosis not present

## 2021-02-09 DIAGNOSIS — Z793 Long term (current) use of hormonal contraceptives: Secondary | ICD-10-CM | POA: Insufficient documentation

## 2021-02-09 DIAGNOSIS — Z7984 Long term (current) use of oral hypoglycemic drugs: Secondary | ICD-10-CM | POA: Insufficient documentation

## 2021-02-09 DIAGNOSIS — Z882 Allergy status to sulfonamides status: Secondary | ICD-10-CM | POA: Insufficient documentation

## 2021-02-09 HISTORY — PX: CARPAL TUNNEL RELEASE: SHX101

## 2021-02-09 LAB — GLUCOSE, CAPILLARY
Glucose-Capillary: 179 mg/dL — ABNORMAL HIGH (ref 70–99)
Glucose-Capillary: 166 mg/dL — ABNORMAL HIGH (ref 70–99)

## 2021-02-09 LAB — GLUCOSE, POCT (MANUAL RESULT ENTRY): POC Glucose: 179 mg/dl — AB (ref 70–99)

## 2021-02-09 LAB — POCT PREGNANCY, URINE
Preg Test, Ur: NEGATIVE
Preg Test, Ur: NEGATIVE

## 2021-02-09 SURGERY — RELEASE, CARPAL TUNNEL, ENDOSCOPIC
Anesthesia: General | Site: Wrist | Laterality: Right

## 2021-02-09 MED ORDER — DEXMEDETOMIDINE (PRECEDEX) IN NS 20 MCG/5ML (4 MCG/ML) IV SYRINGE
PREFILLED_SYRINGE | INTRAVENOUS | Status: DC | PRN
  Administered 2021-02-09: 4 ug via INTRAVENOUS

## 2021-02-09 MED ORDER — LIDOCAINE HCL (CARDIAC) PF 100 MG/5ML IV SOSY
PREFILLED_SYRINGE | INTRAVENOUS | Status: DC | PRN
  Administered 2021-02-09: 50 mg via INTRAVENOUS

## 2021-02-09 MED ORDER — DEXAMETHASONE SODIUM PHOSPHATE 10 MG/ML IJ SOLN
INTRAMUSCULAR | Status: DC | PRN
  Administered 2021-02-09: 5 mg via INTRAVENOUS

## 2021-02-09 MED ORDER — FENTANYL CITRATE (PF) 100 MCG/2ML IJ SOLN
INTRAMUSCULAR | Status: AC
  Filled 2021-02-09: qty 2

## 2021-02-09 MED ORDER — PROPOFOL 10 MG/ML IV BOLUS
INTRAVENOUS | Status: DC | PRN
  Administered 2021-02-09: 180 mg via INTRAVENOUS

## 2021-02-09 MED ORDER — BUPIVACAINE HCL (PF) 0.5 % IJ SOLN
INTRAMUSCULAR | Status: DC | PRN
  Administered 2021-02-09: 10 mL

## 2021-02-09 MED ORDER — FENTANYL CITRATE (PF) 100 MCG/2ML IJ SOLN
INTRAMUSCULAR | Status: DC | PRN
  Administered 2021-02-09 (×2): 50 ug via INTRAVENOUS

## 2021-02-09 MED ORDER — CEFAZOLIN SODIUM-DEXTROSE 2-4 GM/100ML-% IV SOLN
INTRAVENOUS | Status: AC
  Filled 2021-02-09: qty 100

## 2021-02-09 MED ORDER — 0.9 % SODIUM CHLORIDE (POUR BTL) OPTIME
TOPICAL | Status: DC | PRN
  Administered 2021-02-09: 500 mL

## 2021-02-09 MED ORDER — ONDANSETRON HCL 4 MG/2ML IJ SOLN
INTRAMUSCULAR | Status: DC | PRN
  Administered 2021-02-09: 4 mg via INTRAVENOUS

## 2021-02-09 MED ORDER — PROPOFOL 10 MG/ML IV BOLUS
INTRAVENOUS | Status: AC
  Filled 2021-02-09: qty 20

## 2021-02-09 MED ORDER — ACETAMINOPHEN 10 MG/ML IV SOLN
INTRAVENOUS | Status: DC | PRN
  Administered 2021-02-09: 1000 mg via INTRAVENOUS

## 2021-02-09 MED ORDER — OXYCODONE HCL 5 MG PO TABS
5.0000 mg | ORAL_TABLET | Freq: Once | ORAL | Status: DC | PRN
Start: 2021-02-09 — End: 2021-02-09

## 2021-02-09 MED ORDER — MIDAZOLAM HCL 2 MG/2ML IJ SOLN
INTRAMUSCULAR | Status: AC
  Filled 2021-02-09: qty 2

## 2021-02-09 MED ORDER — FENTANYL CITRATE (PF) 100 MCG/2ML IJ SOLN: 25.0000 ug | INTRAMUSCULAR | Status: DC | PRN

## 2021-02-09 MED ORDER — OXYCODONE HCL 5 MG/5ML PO SOLN: 5.0000 mg | Freq: Once | ORAL | Status: DC | PRN

## 2021-02-09 MED ORDER — BUPIVACAINE HCL (PF) 0.5 % IJ SOLN
INTRAMUSCULAR | Status: AC
  Filled 2021-02-09: qty 30

## 2021-02-09 MED ORDER — MIDAZOLAM HCL 2 MG/2ML IJ SOLN
INTRAMUSCULAR | Status: DC | PRN
  Administered 2021-02-09: 2 mg via INTRAVENOUS

## 2021-02-09 MED ORDER — CHLORHEXIDINE GLUCONATE 0.12 % MT SOLN
OROMUCOSAL | Status: AC
  Administered 2021-02-09: 15 mL via OROMUCOSAL
  Filled 2021-02-09: qty 15

## 2021-02-09 MED ORDER — FAMOTIDINE 20 MG PO TABS
ORAL_TABLET | ORAL | Status: AC
  Administered 2021-02-09: 20 mg via ORAL
  Filled 2021-02-09: qty 1

## 2021-02-09 SURGICAL SUPPLY — 34 items
APL PRP STRL LF DISP 70% ISPRP (MISCELLANEOUS) ×1
BNDG COHESIVE 4X5 TAN ST LF (GAUZE/BANDAGES/DRESSINGS) ×2 IMPLANT
BNDG ELASTIC 2X5.8 VLCR STR LF (GAUZE/BANDAGES/DRESSINGS) ×2 IMPLANT
BNDG ESMARK 4X12 TAN STRL LF (GAUZE/BANDAGES/DRESSINGS) ×2 IMPLANT
CHLORAPREP W/TINT 26 (MISCELLANEOUS) ×2 IMPLANT
CORD BIP STRL DISP 12FT (MISCELLANEOUS) ×2 IMPLANT
CUFF TOURN SGL QUICK 18X4 (TOURNIQUET CUFF) ×2 IMPLANT
DRAPE SURG 17X11 SM STRL (DRAPES) ×2 IMPLANT
FORCEPS JEWEL BIP 4-3/4 STR (INSTRUMENTS) ×2 IMPLANT
GAUZE 4X4 16PLY ~~LOC~~+RFID DBL (SPONGE) ×2 IMPLANT
GAUZE SPONGE 4X4 12PLY STRL (GAUZE/BANDAGES/DRESSINGS) ×2 IMPLANT
GAUZE SPONGE 4X4 16PLY XRAY LF (GAUZE/BANDAGES/DRESSINGS) ×2 IMPLANT
GAUZE XEROFORM 1X8 LF (GAUZE/BANDAGES/DRESSINGS) ×2 IMPLANT
GLOVE SURG ENC MOIS LTX SZ8 (GLOVE) ×4 IMPLANT
GLOVE SURG UNDER LTX SZ8 (GLOVE) ×2 IMPLANT
GOWN STRL REUS W/ TWL LRG LVL3 (GOWN DISPOSABLE) ×2 IMPLANT
GOWN STRL REUS W/ TWL XL LVL3 (GOWN DISPOSABLE) ×1 IMPLANT
GOWN STRL REUS W/TWL LRG LVL3 (GOWN DISPOSABLE) ×4
GOWN STRL REUS W/TWL XL LVL3 (GOWN DISPOSABLE) ×2
KIT CARPAL TUNNEL (MISCELLANEOUS) ×2
KIT ESCP INSRT D SLOT CANN KN (MISCELLANEOUS) ×1 IMPLANT
KIT TURNOVER KIT A (KITS) ×2 IMPLANT
MANIFOLD NEPTUNE II (INSTRUMENTS) ×2 IMPLANT
NS IRRIG 500ML POUR BTL (IV SOLUTION) ×2 IMPLANT
PACK EXTREMITY ARMC (MISCELLANEOUS) ×2 IMPLANT
SPLINT WRIST LG LT TX990309 (SOFTGOODS) IMPLANT
SPLINT WRIST LG RT TX900304 (SOFTGOODS) IMPLANT
SPLINT WRIST M LT TX990308 (SOFTGOODS) IMPLANT
SPLINT WRIST M RT TX990303 (SOFTGOODS) IMPLANT
SPLINT WRIST XL LT TX990310 (SOFTGOODS) IMPLANT
SPLINT WRIST XL RT TX990305 (SOFTGOODS) ×2 IMPLANT
STOCKINETTE IMPERVIOUS 9X36 MD (GAUZE/BANDAGES/DRESSINGS) ×2 IMPLANT
SUT PROLENE 4 0 PS 2 18 (SUTURE) ×2 IMPLANT
WATER STERILE IRR 500ML POUR (IV SOLUTION) ×2 IMPLANT

## 2021-02-09 NOTE — OR Nursing (Signed)
FSBS 179 this am. Continue to monitor.

## 2021-02-09 NOTE — H&P (Signed)
History of Present Illness: Tabitha Maynard is a 45 y.o. female who presents today for her surgical history and physical for upcoming right endoscopic carpal tunnel release procedure scheduled with Dr. Joice Lofts on 02/09/2021. The patient denies any changes in her medical history since she was last evaluated. The patient denies any trauma or injury affecting the right upper extremity. The patient is still experiencing numbness and tingling in the right upper extremity especially with prolonged periods of activities. The patient is also experiencing a sharp pain that will wake her up at night as well. She denies any history of heart attack, stroke, asthma or COPD. No personal history of blood clots.  Past Medical History:  Diabetes mellitus without complication (CMS[facility])   Hyperlipidemia   Hypertension   Past Surgical History:  COLONOSCOPY 02/21/2019 (Tubular adenoma)   Past Family History:  No Known Problems Mother   Myocardial Infarction (Heart attack) Father   Diabetes type II Father   Medications:  atorvastatin (LIPITOR) 10 MG tablet TAKE 1 TABLET BY MOUTH ONCE DAILY 90 tablet 3   blood glucose diagnostic (CONTOUR NEXT TEST STRIPS) test strip 1 each (1 strip total) 4 (four) times daily Use as instructed. 400 each 3   blood glucose diagnostic test strip Use 4 (four) times daily Use as instructed. Dx E11.9 ONE TOUCH VERIO FLEX 400 each 3   CONTOUR NEXT LEV 1 CONTROL SOL Soln Use 1 each as directed 1 each 1   CONTOUR NEXT METER Misc 1 each 4 (four) times daily 1 each 0   etodolac (LODINE) 500 MG tablet Take 1 tablet (500 mg total) by mouth 2 (two) times daily 60 tablet 1   glipiZIDE (GLUCOTROL) 2.5 MG XL tablet Take 1 tablet (2.5 mg total) by mouth once daily 90 tablet 1   lancets (MICROLET LANCET) Use 1 each 4 (four) times daily Use as instructed. 400 each 3   lancets (ONETOUCH DELICA LANCETS) 30 gauge Misc Use 1 each 4 (four) times daily Dx E11.9 400 each 3   LINZESS 72 mcg capsule TAKE 1  CAPSULE(72 MCG) BY MOUTH EVERY DAY 90 capsule 1   loratadine (CLARITIN) 10 mg tablet Take 10 mg by mouth once daily.   losartan (COZAAR) 100 MG tablet TAKE 1 TABLET BY MOUTH ONCE DAILY 90 tablet 3   medroxyPROGESTERone (PROVERA) 10 MG tablet TAKE 1 TABLET BY MOUTH ONCE DAILY 90 tablet 3   metFORMIN (GLUCOPHAGE) 500 MG tablet TAKE 1 TABLET BY MOUTH DAILY WITH BREAKFAST 90 tablet 3   VITAMIN D2 1,250 mcg (50,000 unit) capsule TAKE 1 CAPSULE BY MOUTH ONCE WEEKLY 13 capsule 3   Allergies:  Sulfa (Sulfonamide Antibiotics) Rash   Review of Systems:  A comprehensive 14 point ROS was performed, reviewed by me today, and the pertinent orthopaedic findings are documented in the HPI.  Physical Exam: BP 136/86  Ht 157.5 cm (5\' 2" )  Wt (!) 114.8 kg (253 lb)  BMI 46.27 kg/m   General/Constitutional: The patient appears to be well-nourished, well-developed, and in no acute distress. Neuro/Psych: Normal mood and affect, oriented to person, place and time. Eyes: Non-icteric. Pupils are equal, round, and reactive to light, and exhibit synchronous movement. ENT: Unremarkable. Lymphatic: No palpable adenopathy. Respiratory: Lungs clear to auscultation, Normal chest excursion, No wheezes and Non-labored breathing Cardiovascular: Regular rate and rhythm. No murmurs. and No edema, swelling or tenderness, except as noted in detailed exam. Integumentary: No impressive skin lesions present, except as noted in detailed exam. Musculoskeletal: Unremarkable, except as noted  in detailed exam.  Neck: Neck has full range of motion. There is no tenderness to palpation. Spurling's test is negative.   Right Upper Extremity: Normal shoulder contour. Good active and passive range of motion and stability of the shoulder, elbow, and wrist. Normal motion of the hand and digits. No swelling, erythema, or ecchymosis is noted. There is no triggering or locking of the digits noted. No thenar atrophy is visualized. No intrinsic  wasting. The patient has a positive Phalen's test. The patient has a positive Tinel's test. The patient has decreased pinprick and light touch sensation in the median nerve distribution. There is normal grip strength and pincer strength. The patient has less than 2 second capillary refill with good skin warmth. Normal radial and ulnar pulse is palpated.   Neurologic: Sensory function is intact, except as noted above. Motor strength is 5/5, except as noted above. No tremor or clonus is present. Good motor coordination is noted.   EMG Results: Did undergo a nerve conduction study of bilateral upper extremities. This nerve conduction study did demonstrate severe carpal tunnel syndrome in the right wrist and moderate carpal tunnel syndrome in the left wrist.  Impression: Carpal tunnel syndrome, right.  Plan:  1. Treatment options were discussed today with the patient. 2. Total for a right endoscopic carpal tunnel release procedure with Dr. Joice Lofts on 02/09/2021. 3. The patient was instructed on the risk and benefits of surgery and wishes to proceed at this time. 4. This document will serve as a surgical history and physical for the patient. 5. The patient will follow-up per standard postop protocol. They can call the clinic they have any questions, new symptoms develop or symptoms worsen.  The procedure was discussed with the patient, as were the potential risks (including bleeding, infection, nerve and/or blood vessel injury, persistent or recurrent pain, failure of the repair, progression of arthritis, need for further surgery, blood clots, strokes, heart attacks and/or arhythmias, pneumonia, etc.) and benefits. The patient states her understanding and wishes to proceed.   H&P reviewed and patient re-examined. No changes.

## 2021-02-09 NOTE — Transfer of Care (Signed)
Immediate Anesthesia Transfer of Care Note  Patient: Tabitha Maynard  Procedure(s) Performed: CARPAL TUNNEL RELEASE ENDOSCOPIC (Right: Wrist)  Patient Location: PACU  Anesthesia Type:General  Level of Consciousness: drowsy  Airway & Oxygen Therapy: Patient Spontanous Breathing and Patient connected to face mask oxygen  Post-op Assessment: Report given to RN and Post -op Vital signs reviewed and stable  Post vital signs: Reviewed and stable  Last Vitals:  Vitals Value Taken Time  BP 115/64 02/09/21 0818  Temp    Pulse 64 02/09/21 0821  Resp 13 02/09/21 0821  SpO2 99 % 02/09/21 0821  Vitals shown include unvalidated device data.  Last Pain:  Vitals:   02/09/21 0634  TempSrc: Temporal  PainSc: 0-No pain         Complications: No notable events documented.

## 2021-02-09 NOTE — Discharge Instructions (Addendum)
Orthopedic discharge instructions: Keep dressing dry and intact. Keep hand elevated above heart level. May shower after dressing removed on postop day 4 (Sunday). Cover sutures with Band-Aids after drying off. Apply ice to affected area frequently. Take ibuprofen 600-800 mg TID OR Aleve 2 tabs BID with meals for 5-7 days, then as necessary. Take ES Tylenol if needed.  Return for follow-up in 10-14 days or as scheduled.  AMBULATORY SURGERY  DISCHARGE INSTRUCTIONS   The drugs that you were given will stay in your system until tomorrow so for the next 24 hours you should not:  Drive an automobile Make any legal decisions Drink any alcoholic beverage   You may resume regular meals tomorrow.  Today it is better to start with liquids and gradually work up to solid foods.  You may eat anything you prefer, but it is better to start with liquids, then soup and crackers, and gradually work up to solid foods.   Please notify your doctor immediately if you have any unusual bleeding, trouble breathing, redness and pain at the surgery site, drainage, fever, or pain not relieved by medication.    Additional Instructions:        Please contact your physician with any problems or Same Day Surgery at [phone], Monday through Friday 6 am to 4 pm, or  at [facility] number at [phone].

## 2021-02-09 NOTE — Anesthesia Postprocedure Evaluation (Signed)
Anesthesia Post Note  Patient: Tabitha Maynard  Procedure(s) Performed: CARPAL TUNNEL RELEASE ENDOSCOPIC (Right: Wrist)  Patient location during evaluation: PACU Anesthesia Type: General Level of consciousness: awake and alert Pain management: pain level controlled Vital Signs Assessment: post-procedure vital signs reviewed and stable Respiratory status: spontaneous breathing, nonlabored ventilation, respiratory function stable and patient connected to nasal cannula oxygen Cardiovascular status: blood pressure returned to baseline and stable Postop Assessment: no apparent nausea or vomiting Anesthetic complications: no   No notable events documented.   Last Vitals:  Vitals:   02/09/21 0900 02/09/21 0909  BP: 118/77 (!) 114/58  Pulse: 69 66  Resp: 17 16  Temp: 36.9 C (!) 36.4 C  SpO2: 98% 99%    Last Pain:  Vitals:   02/09/21 0909  TempSrc: Temporal  PainSc: 0-No pain                 Cleda Mccreedy Brantlee Penn

## 2021-02-09 NOTE — Anesthesia Procedure Notes (Signed)
Procedure Name: Intubation Date/Time: 02/09/2021 7:33 AM Performed by: Cheral Bay, CRNA Pre-anesthesia Checklist: Patient identified, Emergency Drugs available, Suction available and Patient being monitored Patient Re-evaluated:Patient Re-evaluated prior to induction Oxygen Delivery Method: Circle system utilized Preoxygenation: Pre-oxygenation with 100% oxygen Induction Type: IV induction Ventilation: Mask ventilation without difficulty LMA: LMA inserted LMA Size: 4.5 Tube type: Oral Placement Confirmation: ETT inserted through vocal cords under direct vision, positive ETCO2 and breath sounds checked- equal and bilateral Tube secured with: Tape Dental Injury: Teeth and Oropharynx as per pre-operative assessment

## 2021-02-09 NOTE — Anesthesia Preprocedure Evaluation (Signed)
Anesthesia Evaluation  Patient identified by MRN, date of birth, ID band Patient awake    Reviewed: Allergy & Precautions, NPO status , Patient's Chart, lab work & pertinent test results  History of Anesthesia Complications Negative for: history of anesthetic complications  Airway Mallampati: III  TM Distance: >3 FB Neck ROM: full    Dental  (+) Chipped   Pulmonary neg pulmonary ROS, neg shortness of breath, former smoker,    Pulmonary exam normal        Cardiovascular Exercise Tolerance: Good hypertension, (-) angina(-) Past MI and (-) DOE Normal cardiovascular exam     Neuro/Psych negative neurological ROS  negative psych ROS   GI/Hepatic negative GI ROS, Neg liver ROS, neg GERD  ,  Endo/Other  diabetes, Type 2  Renal/GU      Musculoskeletal   Abdominal   Peds  Hematology negative hematology ROS (+)   Anesthesia Other Findings Past Medical History: No date: History of hiatal hernia No date: Hypertension No date: Type 2 diabetes mellitus (HCC)  Past Surgical History: No date: COLONOSCOPY  BMI    Body Mass Index: 45.73 kg/m      Reproductive/Obstetrics negative OB ROS                             Anesthesia Physical Anesthesia Plan  ASA: 3  Anesthesia Plan: General LMA   Post-op Pain Management:    Induction: Intravenous  PONV Risk Score and Plan: Dexamethasone, Ondansetron, Midazolam and Treatment may vary due to age or medical condition  Airway Management Planned: LMA  Additional Equipment:   Intra-op Plan:   Post-operative Plan: Extubation in OR  Informed Consent: I have reviewed the patients History and Physical, chart, labs and discussed the procedure including the risks, benefits and alternatives for the proposed anesthesia with the patient or authorized representative who has indicated his/her understanding and acceptance.     Dental Advisory  Given  Plan Discussed with: Anesthesiologist, CRNA and Surgeon  Anesthesia Plan Comments: (Patient consented for risks of anesthesia including but not limited to:  - adverse reactions to medications - damage to eyes, teeth, lips or other oral mucosa - nerve damage due to positioning  - sore throat or hoarseness - Damage to heart, brain, nerves, lungs, other parts of body or loss of life  Patient voiced understanding.)        Anesthesia Quick Evaluation

## 2021-02-09 NOTE — Op Note (Signed)
02/09/2021  8:30 AM  Patient:   Tabitha Maynard  Pre-Op Diagnosis:   Right carpal tunnel syndrome.  Post-Op Diagnosis:   Same.  Procedure:   Endoscopic right carpal tunnel release.  Surgeon:   Maryagnes Amos, MD  Assistant:   Frederic Jericho, PA-S  Anesthesia:   General LMA  Findings:   As above.  Complications:   None  EBL:   0 cc  Fluids:   300 cc crystalloid  TT:   17 minutes at 250 mmHg  Drains:   None  Closure:   4-0 Prolene interrupted sutures  Brief Clinical Note:   The patient is a 45 year old female with a history of progressively worsening pain and paresthesias to her right wrist. Her symptoms have progressed despite medications, activity modification, etc. Her history and examination are consistent with carpal tunnel syndrome, confirmed by EMG. The patient presents at this time for an endoscopic right carpal tunnel release.   Procedure:   The patient was brought into the operating room and lain in the supine position. After adequate general laryngeal mask anesthesia was obtained, the right hand and upper extremity were prepped with ChloraPrep solution before being draped sterilely. Preoperative antibiotics were administered. A timeout was performed to verify the appropriate surgical site before the limb was exsanguinated with an Esmarch and the tourniquet inflated to 250 mmHg.   An approximately 1.5-2 cm incision was made over the volar wrist flexion crease, centered over the palmaris longus tendon. The incision was carried down through the subcutaneous tissues with care taken to identify and protect any neurovascular structures. The distal forearm fascia was penetrated just proximal to the transverse carpal ligament. The soft tissues were released off the superficial and deep surfaces of the distal forearm fascia and this was released proximally for 3-4 cm under direct visualization.  Attention was directed distally. The Therapist, nutritional was passed beneath the transverse  carpal ligament along the ulnar aspect of the carpal tunnel and used to release any adhesions as well as to remove any adherent synovial tissue before first the smaller then the larger of the two dilators were passed beneath the transverse carpal ligament along the ulnar margin of the carpal tunnel. The slotted cannula was introduced and the endoscope was placed into the slotted cannula and the undersurface of the transverse carpal ligament visualized. The distal margin of the transverse carpal ligament was marked by placing a 25-gauge needle percutaneously at Kaplan's cardinal point so that it entered the distal portion of the slotted cannula. Under endoscopic visualization, the transverse carpal ligament was released from proximal to distal using the end-cutting blade. A second pass was performed to ensure complete release of the ligament. The adequacy of release was verified both endoscopically and by palpation using the freer elevator.  The wound was irrigated thoroughly with sterile saline solution before being closed using 4-0 Prolene interrupted sutures. A total of 10 cc of 0.5% plain Sensorcaine was injected in and around the incision before a sterile bulky dressing was applied to the wound. The patient was placed into a volar wrist splint before being awakened, extubated, and returned to the recovery room in satisfactory condition after tolerating the procedure well.

## 2021-03-07 ENCOUNTER — Other Ambulatory Visit: Payer: Self-pay | Admitting: Surgery

## 2021-03-10 ENCOUNTER — Encounter
Admission: RE | Admit: 2021-03-10 | Discharge: 2021-03-10 | Disposition: A | Discharge status: Home / Self Care | Payer: Managed Care, Other (non HMO) | Source: Ambulatory Visit | Attending: Surgery | Admitting: Surgery

## 2021-03-10 ENCOUNTER — Other Ambulatory Visit: Payer: Self-pay

## 2021-03-10 NOTE — Patient Instructions (Addendum)
Your procedure is scheduled on: Thursday 03/17/21 Report to the Registration Desk on the 1st floor of the [facility]. To find out your arrival time, please call [phone] between 1PM - 3PM on: Wednesday 03/16/21  REMEMBER: Instructions that are not followed completely may result in serious medical risk, up to and including death; or upon the discretion of your surgeon and anesthesiologist your surgery may need to be rescheduled.  Do not eat food after midnight the night before surgery.  No gum chewing, lozengers or hard candies.  You may however, drink water up to 2 hours before you are scheduled to arrive for your surgery. Do not drink anything within 2 hours of your scheduled arrival time.   Type 1 and Type 2 diabetics should only drink water.  TAKE THESE MEDICATIONS THE MORNING OF SURGERY WITH A SIP OF WATER: atorvastatin (LIPITOR) 10 MG tablet medroxyPROGESTERone (PROVERA) 10 MG tablet  Take metFORMIN (GLUCOPHAGE) 500 MG tablet  on Monday 03/15/21 and no more until after surgery.   One week prior to surgery which is today 03/10/21: Stop Anti-inflammatories (NSAIDS) such as Advil, Aleve, Ibuprofen, Motrin, Naproxen, Naprosyn and Aspirin based products such as Excedrin, Goodys Powder, BC Powder. Stop ANY Multiple Vitamin (MULTIVITAMIN WITH MINERALS) TABS , Vitamin D, Ergocalciferol, (DRISDOL) 1.25 MG (50000 UNIT) CAPS capsule, and OVER THE COUNTER supplements until after surgery.  You may however, continue to take Tylenol if needed for pain up until the day of surgery.  No Alcohol for 24 hours before or after surgery.  No Smoking including e-cigarettes for 24 hours prior to surgery.  No chewable tobacco products for at least 6 hours prior to surgery.  No nicotine patches on the day of surgery.  Do not use any "recreational" drugs for at least a week prior to your surgery.  Please be advised that the combination of cocaine and anesthesia may have negative outcomes, up to and  including death. If you test positive for cocaine, your surgery will be cancelled.  On the morning of surgery brush your teeth with toothpaste and water, you may rinse your mouth with mouthwash if you wish. Do not swallow any toothpaste or mouthwash.  Do not wear jewelry, make-up, hairpins, clips or nail polish.  Do not wear lotions, powders, or perfumes.   Do not shave body from the neck down 48 hours prior to surgery just in case you cut yourself which could leave a site for infection.  Also, freshly shaved skin may become irritated if using the CHG soap.  Do not bring valuables to the hospital. [facility] is not responsible for any missing/lost belongings or valuables.   Notify your doctor if there is any change in your medical condition (cold, fever, infection).  Wear comfortable clothing (specific to your surgery type) to the hospital.  After surgery, you can help prevent lung complications by doing breathing exercises.  Take deep breaths and cough every 1-2 hours. Your doctor may order a device called an Incentive Spirometer to help you take deep breaths.  If you are being discharged the day of surgery, you will not be allowed to drive home. You will need a responsible adult (18 years or older) to drive you home and stay with you that night.   If you are taking public transportation, you will need to have a responsible adult (18 years or older) with you. Please confirm with your physician that it is acceptable to use public transportation.   Please call the Pre-admissions Testing Dept.  at [phone] if you have any questions about these instructions.  Surgery Visitation Policy:  Patients undergoing a surgery or procedure may have one family member or support person with them as long as that person is not COVID-19 positive or experiencing its symptoms.  That person may remain in the waiting area during the procedure and may rotate out with other people.  Inpatient  Visitation:    Visiting hours are 7 a.m. to 8 p.m. Up to two visitors ages 16+ are allowed at one time in a patient room. The visitors may rotate out with other people during the day. Visitors must check out when they leave, or other visitors will not be allowed. One designated support person may remain overnight. The visitor must pass COVID-19 screenings, use hand sanitizer when entering and exiting the patient's room and wear a mask at all times, including in the patient's room. Patients must also wear a mask when staff or their visitor are in the room. Masking is required regardless of vaccination status.

## 2021-03-17 ENCOUNTER — Other Ambulatory Visit: Payer: Self-pay

## 2021-03-17 ENCOUNTER — Ambulatory Visit: Payer: Managed Care, Other (non HMO) | Admitting: Certified Registered"

## 2021-03-17 ENCOUNTER — Encounter
Admission: RE | Disposition: A | Discharge status: Home / Self Care | Payer: Self-pay | Source: Home / Self Care | Attending: Surgery

## 2021-03-17 ENCOUNTER — Encounter: Payer: Self-pay | Admitting: Surgery

## 2021-03-17 ENCOUNTER — Ambulatory Visit
Admission: RE | Admit: 2021-03-17 | Discharge: 2021-03-17 | Disposition: A | Discharge status: Home / Self Care | Payer: Managed Care, Other (non HMO) | Attending: Surgery | Admitting: Surgery

## 2021-03-17 DIAGNOSIS — Z9889 Other specified postprocedural states: Secondary | ICD-10-CM | POA: Insufficient documentation

## 2021-03-17 DIAGNOSIS — G5602 Carpal tunnel syndrome, left upper limb: Secondary | ICD-10-CM | POA: Diagnosis not present

## 2021-03-17 HISTORY — PX: CARPAL TUNNEL RELEASE: SHX101

## 2021-03-17 LAB — GLUCOSE, CAPILLARY
Glucose-Capillary: 167 mg/dL — ABNORMAL HIGH (ref 70–99)
Glucose-Capillary: 171 mg/dL — ABNORMAL HIGH (ref 70–99)

## 2021-03-17 LAB — POCT PREGNANCY, URINE: Preg Test, Ur: NEGATIVE

## 2021-03-17 SURGERY — RELEASE, CARPAL TUNNEL, ENDOSCOPIC
Anesthesia: General | Site: Wrist | Laterality: Left

## 2021-03-17 MED ORDER — FENTANYL CITRATE (PF) 100 MCG/2ML IJ SOLN: 25.0000 ug | INTRAMUSCULAR | Status: DC | PRN

## 2021-03-17 MED ORDER — DEXAMETHASONE SODIUM PHOSPHATE 10 MG/ML IJ SOLN
INTRAMUSCULAR | Status: AC
  Filled 2021-03-17: qty 1

## 2021-03-17 MED ORDER — LIDOCAINE HCL (PF) 2 % IJ SOLN
INTRAMUSCULAR | Status: AC
  Filled 2021-03-17: qty 5

## 2021-03-17 MED ORDER — EPHEDRINE 5 MG/ML INJ
INTRAVENOUS | Status: AC
  Filled 2021-03-17: qty 5

## 2021-03-17 MED ORDER — ACETAMINOPHEN 10 MG/ML IV SOLN
INTRAVENOUS | Status: DC | PRN
  Administered 2021-03-17: 1000 mg via INTRAVENOUS

## 2021-03-17 MED ORDER — DEXAMETHASONE SODIUM PHOSPHATE 10 MG/ML IJ SOLN
INTRAMUSCULAR | Status: DC | PRN
  Administered 2021-03-17: 4 mg via INTRAVENOUS

## 2021-03-17 MED ORDER — ORAL CARE MOUTH RINSE: 15.0000 mL | Freq: Once | OROMUCOSAL | Status: AC

## 2021-03-17 MED ORDER — CHLORHEXIDINE GLUCONATE 0.12 % MT SOLN
OROMUCOSAL | Status: AC
  Administered 2021-03-17: 15 mL via OROMUCOSAL
  Filled 2021-03-17: qty 15

## 2021-03-17 MED ORDER — FENTANYL CITRATE (PF) 100 MCG/2ML IJ SOLN
INTRAMUSCULAR | Status: AC
  Filled 2021-03-17: qty 2

## 2021-03-17 MED ORDER — PHENYLEPHRINE HCL-NACL 20-0.9 MG/250ML-% IV SOLN
INTRAVENOUS | Status: AC
  Filled 2021-03-17: qty 250

## 2021-03-17 MED ORDER — BUPIVACAINE HCL (PF) 0.5 % IJ SOLN
INTRAMUSCULAR | Status: DC | PRN
  Administered 2021-03-17: 10 mL

## 2021-03-17 MED ORDER — SODIUM CHLORIDE 0.9 % IV SOLN: INTRAVENOUS | Status: DC

## 2021-03-17 MED ORDER — LIDOCAINE HCL (CARDIAC) PF 100 MG/5ML IV SOSY
PREFILLED_SYRINGE | INTRAVENOUS | Status: DC | PRN
  Administered 2021-03-17: 100 mg via INTRAVENOUS

## 2021-03-17 MED ORDER — CEFAZOLIN SODIUM-DEXTROSE 2-4 GM/100ML-% IV SOLN
INTRAVENOUS | Status: AC
  Filled 2021-03-17: qty 100

## 2021-03-17 MED ORDER — ONDANSETRON HCL 4 MG/2ML IJ SOLN
INTRAMUSCULAR | Status: DC | PRN
  Administered 2021-03-17: 4 mg via INTRAVENOUS

## 2021-03-17 MED ORDER — FAMOTIDINE 20 MG PO TABS: 20.0000 mg | ORAL_TABLET | Freq: Once | ORAL | Status: AC

## 2021-03-17 MED ORDER — PROPOFOL 10 MG/ML IV BOLUS
INTRAVENOUS | Status: AC
  Filled 2021-03-17: qty 20

## 2021-03-17 MED ORDER — BUPIVACAINE HCL (PF) 0.5 % IJ SOLN
INTRAMUSCULAR | Status: AC
  Filled 2021-03-17: qty 30

## 2021-03-17 MED ORDER — ACETAMINOPHEN 10 MG/ML IV SOLN
INTRAVENOUS | Status: AC
  Filled 2021-03-17: qty 100

## 2021-03-17 MED ORDER — FAMOTIDINE 20 MG PO TABS
ORAL_TABLET | ORAL | Status: AC
  Administered 2021-03-17: 20 mg via ORAL
  Filled 2021-03-17: qty 1

## 2021-03-17 MED ORDER — PROPOFOL 10 MG/ML IV BOLUS
INTRAVENOUS | Status: DC | PRN
  Administered 2021-03-17: 200 mg via INTRAVENOUS

## 2021-03-17 MED ORDER — ONDANSETRON HCL 4 MG/2ML IJ SOLN
INTRAMUSCULAR | Status: AC
  Filled 2021-03-17: qty 2

## 2021-03-17 MED ORDER — DEXMEDETOMIDINE HCL IN NACL 200 MCG/50ML IV SOLN
INTRAVENOUS | Status: AC
  Filled 2021-03-17: qty 50

## 2021-03-17 MED ORDER — CHLORHEXIDINE GLUCONATE 0.12 % MT SOLN: 15.0000 mL | Freq: Once | OROMUCOSAL | Status: AC

## 2021-03-17 MED ORDER — FENTANYL CITRATE (PF) 100 MCG/2ML IJ SOLN
INTRAMUSCULAR | Status: DC | PRN
  Administered 2021-03-17 (×3): 25 ug via INTRAVENOUS

## 2021-03-17 MED ORDER — ONDANSETRON HCL 4 MG/2ML IJ SOLN: 4.0000 mg | Freq: Once | INTRAMUSCULAR | Status: DC | PRN

## 2021-03-17 MED ORDER — DEXMEDETOMIDINE HCL IN NACL 200 MCG/50ML IV SOLN
INTRAVENOUS | Status: DC | PRN
  Administered 2021-03-17: 8 ug via INTRAVENOUS
  Administered 2021-03-17: 12 ug via INTRAVENOUS

## 2021-03-17 MED ORDER — CEFAZOLIN SODIUM-DEXTROSE 2-4 GM/100ML-% IV SOLN
2.0000 g | INTRAVENOUS | Status: AC
  Administered 2021-03-17: 2 g via INTRAVENOUS

## 2021-03-17 MED ORDER — MIDAZOLAM HCL 2 MG/2ML IJ SOLN
INTRAMUSCULAR | Status: DC | PRN
  Administered 2021-03-17: 2 mg via INTRAVENOUS

## 2021-03-17 MED ORDER — 0.9 % SODIUM CHLORIDE (POUR BTL) OPTIME
TOPICAL | Status: DC | PRN
  Administered 2021-03-17: 60 mL

## 2021-03-17 MED ORDER — MIDAZOLAM HCL 2 MG/2ML IJ SOLN
INTRAMUSCULAR | Status: AC
  Filled 2021-03-17: qty 2

## 2021-03-17 SURGICAL SUPPLY — 33 items
APL PRP STRL LF DISP 70% ISPRP (MISCELLANEOUS) ×1
BNDG COHESIVE 4X5 TAN ST LF (GAUZE/BANDAGES/DRESSINGS) ×2 IMPLANT
BNDG ELASTIC 2X5.8 VLCR STR LF (GAUZE/BANDAGES/DRESSINGS) ×2 IMPLANT
BNDG ESMARK 4X12 TAN STRL LF (GAUZE/BANDAGES/DRESSINGS) ×2 IMPLANT
CHLORAPREP W/TINT 26 (MISCELLANEOUS) ×2 IMPLANT
CORD BIP STRL DISP 12FT (MISCELLANEOUS) ×2 IMPLANT
CUFF TOURN SGL QUICK 18X4 (TOURNIQUET CUFF) IMPLANT
DRAPE SURG 17X11 SM STRL (DRAPES) ×2 IMPLANT
FORCEPS JEWEL BIP 4-3/4 STR (INSTRUMENTS) ×2 IMPLANT
GAUZE 4X4 16PLY ~~LOC~~+RFID DBL (SPONGE) ×2 IMPLANT
GAUZE SPONGE 4X4 12PLY STRL (GAUZE/BANDAGES/DRESSINGS) ×2 IMPLANT
GAUZE XEROFORM 1X8 LF (GAUZE/BANDAGES/DRESSINGS) ×2 IMPLANT
GLOVE SURG ENC MOIS LTX SZ8 (GLOVE) ×2 IMPLANT
GLOVE SURG UNDER LTX SZ8 (GLOVE) ×2 IMPLANT
GOWN STRL REUS W/ TWL LRG LVL3 (GOWN DISPOSABLE) ×1 IMPLANT
GOWN STRL REUS W/ TWL XL LVL3 (GOWN DISPOSABLE) ×1 IMPLANT
GOWN STRL REUS W/TWL LRG LVL3 (GOWN DISPOSABLE) ×2
GOWN STRL REUS W/TWL XL LVL3 (GOWN DISPOSABLE) ×2
KIT CARPAL TUNNEL (MISCELLANEOUS) ×2
KIT ESCP INSRT D SLOT CANN KN (MISCELLANEOUS) ×1 IMPLANT
KIT TURNOVER KIT A (KITS) ×2 IMPLANT
MANIFOLD NEPTUNE II (INSTRUMENTS) ×2 IMPLANT
NS IRRIG 500ML POUR BTL (IV SOLUTION) ×2 IMPLANT
PACK EXTREMITY ARMC (MISCELLANEOUS) ×2 IMPLANT
SPLINT WRIST LG LT TX990309 (SOFTGOODS) IMPLANT
SPLINT WRIST LG RT TX900304 (SOFTGOODS) IMPLANT
SPLINT WRIST M LT TX990308 (SOFTGOODS) IMPLANT
SPLINT WRIST M RT TX990303 (SOFTGOODS) IMPLANT
SPLINT WRIST XL LT TX990310 (SOFTGOODS) ×2 IMPLANT
SPLINT WRIST XL RT TX990305 (SOFTGOODS) IMPLANT
STOCKINETTE IMPERVIOUS 9X36 MD (GAUZE/BANDAGES/DRESSINGS) ×2 IMPLANT
SUT PROLENE 4 0 PS 2 18 (SUTURE) ×2 IMPLANT
WATER STERILE IRR 500ML POUR (IV SOLUTION) IMPLANT

## 2021-03-17 NOTE — Discharge Instructions (Addendum)
Orthopedic discharge instructions: Keep dressing dry and intact. Keep hand elevated above heart level. May shower after dressing removed on postop day 4 (Monday). Cover sutures with Band-Aids after drying off. Apply ice to affected area frequently. Take ibuprofen 600-800 mg TID with meals for 7-10 days, then as necessary. Take ES Tylenol or pain medication as prescribed when needed.     (TYLENOL 1,000 MG GIVEN AT THE HOSPITAL AT 10:37 AM) Return for follow-up in 10-14 days or as scheduled.   AMBULATORY SURGERY  DISCHARGE INSTRUCTIONS   The drugs that you were given will stay in your system until tomorrow so for the next 24 hours you should not:  Drive an automobile Make any legal decisions Drink any alcoholic beverage   You may resume regular meals tomorrow.  Today it is better to start with liquids and gradually work up to solid foods.  You may eat anything you prefer, but it is better to start with liquids, then soup and crackers, and gradually work up to solid foods.   Please notify your doctor immediately if you have any unusual bleeding, trouble breathing, redness and pain at the surgery site, drainage, fever, or pain not relieved by medication.    Additional Instructions:        Please contact your physician with any problems or Same Day Surgery at [phone], Monday through Friday 6 am to 4 pm, or [place] at [facility] number at [phone].

## 2021-03-17 NOTE — Anesthesia Procedure Notes (Addendum)
Procedure Name: LMA Insertion Date/Time: 03/17/2021 10:13 AM Performed by: Lynden Oxford, CRNA Pre-anesthesia Checklist: Patient identified, Emergency Drugs available, Suction available and Patient being monitored Patient Re-evaluated:Patient Re-evaluated prior to induction Oxygen Delivery Method: Circle system utilized Preoxygenation: Pre-oxygenation with 100% oxygen Induction Type: IV induction Ventilation: Mask ventilation with difficulty LMA: LMA inserted LMA Size: 4.0 Tube type: Oral Number of attempts: 1 Placement Confirmation: positive ETCO2 and breath sounds checked- equal and bilateral Tube secured with: Tape Dental Injury: Teeth and Oropharynx as per pre-operative assessment  Difficulty Due To: Difficulty was anticipated and Difficult Airway- due to limited oral opening

## 2021-03-17 NOTE — Op Note (Signed)
03/17/2021  10:44 AM  Patient:   Tabitha Maynard  Pre-Op Diagnosis:   Left carpal tunnel syndrome.  Post-Op Diagnosis:   Same.  Procedure:   Endoscopic left carpal tunnel release.  Surgeon:   Maryagnes Amos, MD  Anesthesia:   General LMA  Findings:   As above.  Complications:   None  EBL:   0 cc  Fluids:   500 cc crystalloid  TT:   11 minutes at 250 mmHg  Drains:   None  Closure:   4-0 Prolene interrupted sutures  Brief Clinical Note:   The patient is a 45 year old female with a long history of progressively worsening pain and paresthesias to her left hand. Her symptoms have progressed despite medications, activity modification, etc. Her history and examination are consistent with carpal tunnel syndrome, confirmed by EMG. The patient presents at this time for an endoscopic left carpal tunnel release.   Procedure:   The patient was brought into the operating room and lain in the supine position. After adequate general laryngeal mask anesthesia was obtained, the left hand and upper extremity were prepped with ChloraPrep solution before being draped sterilely. Preoperative antibiotics were administered. A timeout was performed to verify the appropriate surgical site before the limb was exsanguinated with an Esmarch and the tourniquet inflated to 250 mmHg.   An approximately 1.5-2 cm incision was made over the volar wrist flexion crease, centered over the palmaris longus tendon. The incision was carried down through the subcutaneous tissues with care taken to identify and protect any neurovascular structures. The distal forearm fascia was penetrated just proximal to the transverse carpal ligament. The soft tissues were released off the superficial and deep surfaces of the distal forearm fascia and this was released proximally for 3-4 cm under direct visualization.  Attention was directed distally. The Therapist, nutritional was passed beneath the transverse carpal ligament along the ulnar  aspect of the carpal tunnel and used to release any adhesions as well as to remove any adherent synovial tissue before first the smaller then the larger of the two dilators were passed beneath the transverse carpal ligament along the ulnar margin of the carpal tunnel. The slotted cannula was introduced and the endoscope was placed into the slotted cannula and the undersurface of the transverse carpal ligament visualized. The distal margin of the transverse carpal ligament was marked by placing a 25-gauge needle percutaneously at Kaplan's cardinal point so that it entered the distal portion of the slotted cannula. Under endoscopic visualization, the transverse carpal ligament was released from proximal to distal using the end-cutting blade. A second pass was performed to ensure complete release of the ligament. The adequacy of release was verified both endoscopically and by palpation using the freer elevator.  The wound was irrigated thoroughly with sterile saline solution before being closed using 4-0 Prolene interrupted sutures. A total of 10 cc of 0.5% plain Sensorcaine was injected in and around the incision before a sterile bulky dressing was applied to the wound. The patient was placed into a volar wrist splint before being awakened, extubated, and returned to the recovery room in satisfactory condition after tolerating the procedure well.

## 2021-03-17 NOTE — H&P (Signed)
History of Present Illness: Tabitha Maynard is a 45 y.o. female who presents today for her first postop appointment following a right endoscopic carpal tunnel release. Surgery was performed by Dr. Joice Lofts on 02/09/2021. Overall the patient feels that she is doing very well. She denies any numbness or tingling to the right upper extremity at today's visit. She denies any signs of infection at home such as any fevers or any drainage from the right wrist incision site. She denies any trauma or injury affecting the right hand. Shortly after surgery the patient did return to work and has been performing activities without any significant pain or irritation. The patient is very pleased with her current progress, pain score today is a 0 out of 10. The patient is interested in proceeding with a left endoscopic carpal tunnel release as well. The patient did undergo nerve conduction studies in the past which demonstrated moderate carpal tunnel syndrome in the left upper extremity. She denies any changes in her medical history since she was last evaluated. She denies any personal history of heart attack, stroke, asthma or COPD. No personal history of blood clots.  Past Medical History:  Diabetes mellitus without complication (CMS-HCC)   Hyperlipidemia   Hypertension   Past Surgical History:  COLONOSCOPY 02/21/2019 (Tubular adenoma)   Endoscopic right carpal tunnel release Right 02/09/2021 (Dr. Joice Lofts)   Past Family History:  No Known Problems Mother   Myocardial Infarction (Heart attack) Father   Diabetes type II Father   Medications:  atorvastatin (LIPITOR) 10 MG tablet TAKE 1 TABLET BY MOUTH ONCE DAILY 90 tablet 3   blood glucose diagnostic (CONTOUR NEXT TEST STRIPS) test strip 1 each (1 strip total) 4 (four) times daily Use as instructed. 400 each 3   blood glucose diagnostic test strip Use 4 (four) times daily Use as instructed. Dx E11.9 ONE TOUCH VERIO FLEX 400 each 3   CONTOUR NEXT LEV 1 CONTROL SOL Soln  Use 1 each as directed 1 each 1   CONTOUR NEXT METER Misc 1 each 4 (four) times daily 1 each 0   etodolac (LODINE) 500 MG tablet Take 1 tablet (500 mg total) by mouth 2 (two) times daily 60 tablet 1   glipiZIDE (GLUCOTROL) 2.5 MG XL tablet Take 1 tablet (2.5 mg total) by mouth once daily 90 tablet 1   lancets (MICROLET LANCET) Use 1 each 4 (four) times daily Use as instructed. 400 each 3   lancets (ONETOUCH DELICA LANCETS) 30 gauge Misc Use 1 each 4 (four) times daily Dx E11.9 400 each 3   LINZESS 72 mcg capsule TAKE 1 CAPSULE(72 MCG) BY MOUTH EVERY DAY 90 capsule 1   loratadine (CLARITIN) 10 mg tablet Take 10 mg by mouth once daily.   losartan (COZAAR) 100 MG tablet TAKE 1 TABLET BY MOUTH ONCE DAILY 90 tablet 3   medroxyPROGESTERone (PROVERA) 10 MG tablet TAKE 1 TABLET BY MOUTH ONCE DAILY 90 tablet 3   metFORMIN (GLUCOPHAGE) 500 MG tablet TAKE 1 TABLET BY MOUTH DAILY WITH BREAKFAST 90 tablet 3   VITAMIN D2 1,250 mcg (50,000 unit) capsule TAKE 1 CAPSULE BY MOUTH ONCE WEEKLY 13 capsule 3   Allergies:  Sulfa (Sulfonamide Antibiotics) Rash   Review of Systems:  A comprehensive 14 point ROS was performed, reviewed by me today, and the pertinent orthopaedic findings are documented in the HPI.  Physical Exam: BP 138/84  Ht 157.5 cm (5\' 2" )  Wt (!) 114.8 kg (253 lb)  BMI 46.27 kg/m  General/Constitutional: The  patient appears to be well-nourished, well-developed, and in no acute distress. Neuro/Psych: Normal mood and affect, oriented to person, place and time. Eyes: Non-icteric. Pupils are equal, round, and reactive to light, and exhibit synchronous movement. ENT: Unremarkable. Lymphatic: No palpable adenopathy. Respiratory: Lungs clear to auscultation, Normal chest excursion, No wheezes and Non-labored breathing Cardiovascular: Regular rate and rhythm. No murmurs. and No edema, swelling or tenderness, except as noted in detailed exam. Integumentary: No impressive skin lesions present,  except as noted in detailed exam. Musculoskeletal: Unremarkable, except as noted in detailed exam.  Left Upper Extremity: Normal shoulder contour. Good active and passive range of motion and stability of the shoulder, elbow, and wrist. Normal motion of the hand and digits. No swelling, erythema, or ecchymosis is noted. There is no triggering or locking of the digits noted. No thenar atrophy is visualized. No intrinsic wasting. The patient has a positive Phalen's test. The patient has a positive Tinel's test. The patient has decreased pinprick and light touch sensation in the median nerve distribution. There is normal grip strength and pincer strength. The patient has less than 2 second capillary refill with good skin warmth. Normal radial and ulnar pulse is palpated.   Neurologic: Sensory function is intact, except as noted above. Motor strength is 5/5, except as noted above. No tremor or clonus is present. Good motor coordination is noted.   Imaging: Nerve conduction studies to bilateral upper extremities in the past demonstrated severe carpal tunnel in the right upper extremity and moderate carpal tunnel in the left upper extremity.  Impression: Left carpal tunnel syndrome.  Plan:  1. Treatment options were discussed today with the patient. 2. The patient does have a history of left carpal tunnel syndrome as well with moderate findings on nerve conduction study. The patient would like to proceed with scheduling surgery for her left wrist at this time. 3. The risk and benefits were again reviewed with the patient today. The patient would like to proceed with a left endoscopic carpal tunnel release with Dr. Joice Lofts in the future. 4. This document will serve as the surgical history and physical for the patient. 5. The patient will follow-up per standard postop protocol. They can call the clinic they have any questions, new symptoms develop or symptoms worsen.  The procedure was discussed with the  patient, as were the potential risks (including bleeding, infection, nerve and/or blood vessel injury, persistent or recurrent pain, failure of the repair, progression of arthritis, need for further surgery, blood clots, strokes, heart attacks and/or arhythmias, pneumonia, etc.) and benefits. The patient states her understanding and wishes to proceed.   H&P reviewed and patient re-examined. No changes.

## 2021-03-17 NOTE — Transfer of Care (Signed)
Immediate Anesthesia Transfer of Care Note  Patient: ALANE HANSSEN  Procedure(s) Performed: CARPAL TUNNEL RELEASE ENDOSCOPIC (Left: Wrist)  Patient Location: PACU  Anesthesia Type:General  Level of Consciousness: drowsy  Airway & Oxygen Therapy: Patient Spontanous Breathing and Patient connected to face mask oxygen  Post-op Assessment: Report given to RN and Post -op Vital signs reviewed and stable  Post vital signs: Reviewed and stable  Last Vitals:  Vitals Value Taken Time  BP 120/81 03/17/21 1049  Temp    Pulse 68 03/17/21 1051  Resp 12 03/17/21 1051  SpO2 96 % 03/17/21 1051  Vitals shown include unvalidated device data.  Last Pain:  Vitals:   03/17/21 0920  TempSrc: Oral  PainSc: 0-No pain         Complications: No notable events documented.

## 2021-03-17 NOTE — Anesthesia Preprocedure Evaluation (Signed)
Anesthesia Evaluation  Patient identified by MRN, date of birth, ID band Patient awake    Reviewed: Allergy & Precautions, H&P , NPO status , Patient's Chart, lab work & pertinent test results, reviewed documented beta blocker date and time   Airway Mallampati: II  TM Distance: >3 FB Neck ROM: full    Dental  (+) Teeth Intact   Pulmonary neg pulmonary ROS, former smoker,    Pulmonary exam normal        Cardiovascular Exercise Tolerance: Good hypertension, On Medications negative cardio ROS Normal cardiovascular exam Rate:Normal     Neuro/Psych negative neurological ROS  negative psych ROS   GI/Hepatic Neg liver ROS, hiatal hernia,   Endo/Other  diabetesMorbid obesity  Renal/GU negative Renal ROS  negative genitourinary   Musculoskeletal   Abdominal   Peds  Hematology negative hematology ROS (+)   Anesthesia Other Findings   Reproductive/Obstetrics negative OB ROS                             Anesthesia Physical Anesthesia Plan  ASA: 3  Anesthesia Plan: General LMA   Post-op Pain Management:    Induction:   PONV Risk Score and Plan: 4 or greater  Airway Management Planned:   Additional Equipment:   Intra-op Plan:   Post-operative Plan:   Informed Consent: I have reviewed the patients History and Physical, chart, labs and discussed the procedure including the risks, benefits and alternatives for the proposed anesthesia with the patient or authorized representative who has indicated his/her understanding and acceptance.       Plan Discussed with: CRNA  Anesthesia Plan Comments:         Anesthesia Quick Evaluation

## 2021-03-22 NOTE — Anesthesia Postprocedure Evaluation (Signed)
Anesthesia Post Note  Patient: Tabitha Maynard  Procedure(s) Performed: CARPAL TUNNEL RELEASE ENDOSCOPIC (Left: Wrist)  Patient location during evaluation: PACU Anesthesia Type: General Level of consciousness: awake and alert Pain management: pain level controlled Vital Signs Assessment: post-procedure vital signs reviewed and stable Respiratory status: spontaneous breathing, nonlabored ventilation, respiratory function stable and patient connected to nasal cannula oxygen Cardiovascular status: blood pressure returned to baseline and stable Postop Assessment: no apparent nausea or vomiting Anesthetic complications: no   No notable events documented.   Last Vitals:  Vitals:   03/17/21 1130 03/17/21 1150  BP: 120/76 125/71  Pulse: (!) 51 (!) 59  Resp: 15 18  Temp: (!) 36.1 C (!) 36.3 C  SpO2: 94% 100%    Last Pain:  Vitals:   03/18/21 1126  TempSrc:   PainSc: 2                  Yevette Edwards

## 2021-05-23 ENCOUNTER — Other Ambulatory Visit: Payer: Self-pay | Admitting: Internal Medicine

## 2021-05-23 DIAGNOSIS — Z1231 Encounter for screening mammogram for malignant neoplasm of breast: Secondary | ICD-10-CM

## 2021-07-05 ENCOUNTER — Other Ambulatory Visit: Payer: Self-pay

## 2021-07-05 ENCOUNTER — Ambulatory Visit
Admission: RE | Admit: 2021-07-05 | Discharge: 2021-07-05 | Disposition: A | Discharge status: Home / Self Care | Payer: Managed Care, Other (non HMO) | Source: Ambulatory Visit | Attending: Internal Medicine | Admitting: Internal Medicine

## 2021-07-05 DIAGNOSIS — Z1231 Encounter for screening mammogram for malignant neoplasm of breast: Secondary | ICD-10-CM | POA: Diagnosis not present

## 2022-07-11 ENCOUNTER — Other Ambulatory Visit: Payer: Self-pay | Admitting: Internal Medicine

## 2022-07-11 DIAGNOSIS — Z1231 Encounter for screening mammogram for malignant neoplasm of breast: Secondary | ICD-10-CM

## 2022-07-27 ENCOUNTER — Ambulatory Visit
Admission: RE | Admit: 2022-07-27 | Discharge: 2022-07-27 | Disposition: A | Discharge status: Home / Self Care | Payer: Managed Care, Other (non HMO) | Source: Ambulatory Visit | Attending: Internal Medicine | Admitting: Internal Medicine

## 2022-07-27 DIAGNOSIS — Z1231 Encounter for screening mammogram for malignant neoplasm of breast: Secondary | ICD-10-CM | POA: Diagnosis present

## 2023-07-10 ENCOUNTER — Other Ambulatory Visit: Payer: Self-pay | Admitting: Internal Medicine

## 2023-07-10 DIAGNOSIS — Z1231 Encounter for screening mammogram for malignant neoplasm of breast: Secondary | ICD-10-CM

## 2023-08-14 ENCOUNTER — Ambulatory Visit
Admission: RE | Admit: 2023-08-14 | Discharge: 2023-08-14 | Disposition: A | Discharge status: Home / Self Care | Payer: Managed Care, Other (non HMO) | Source: Ambulatory Visit | Attending: Internal Medicine | Admitting: Internal Medicine

## 2023-08-14 DIAGNOSIS — Z1231 Encounter for screening mammogram for malignant neoplasm of breast: Secondary | ICD-10-CM | POA: Diagnosis present

## 2023-08-20 ENCOUNTER — Other Ambulatory Visit: Payer: Self-pay | Admitting: Internal Medicine

## 2023-08-20 DIAGNOSIS — R928 Other abnormal and inconclusive findings on diagnostic imaging of breast: Secondary | ICD-10-CM

## 2023-08-22 ENCOUNTER — Ambulatory Visit
Admission: RE | Admit: 2023-08-22 | Discharge: 2023-08-22 | Discharge status: Home / Self Care | Source: Ambulatory Visit | Attending: Internal Medicine | Admitting: Internal Medicine

## 2023-08-22 ENCOUNTER — Ambulatory Visit
Admission: RE | Admit: 2023-08-22 | Discharge: 2023-08-22 | Disposition: A | Discharge status: Home / Self Care | Source: Ambulatory Visit | Attending: Internal Medicine | Admitting: Internal Medicine

## 2023-08-22 DIAGNOSIS — R928 Other abnormal and inconclusive findings on diagnostic imaging of breast: Secondary | ICD-10-CM | POA: Diagnosis present

## 2024-02-21 IMAGING — MG MM DIGITAL SCREENING BILAT W/ TOMO AND CAD
6 of 10 series · 6 of 30 positions shown · non-contrast
Comparison: Previous exam(s).

CLINICAL DATA: Screening.

EXAM:
DIGITAL SCREENING BILATERAL MAMMOGRAM WITH TOMOSYNTHESIS AND CAD
TECHNIQUE: Bilateral screening digital craniocaudal and mediolateral oblique
mammograms were obtained. Bilateral screening digital breast
tomosynthesis was performed. The images were evaluated with
computer-aided detection.

[R MLO synth-2D]
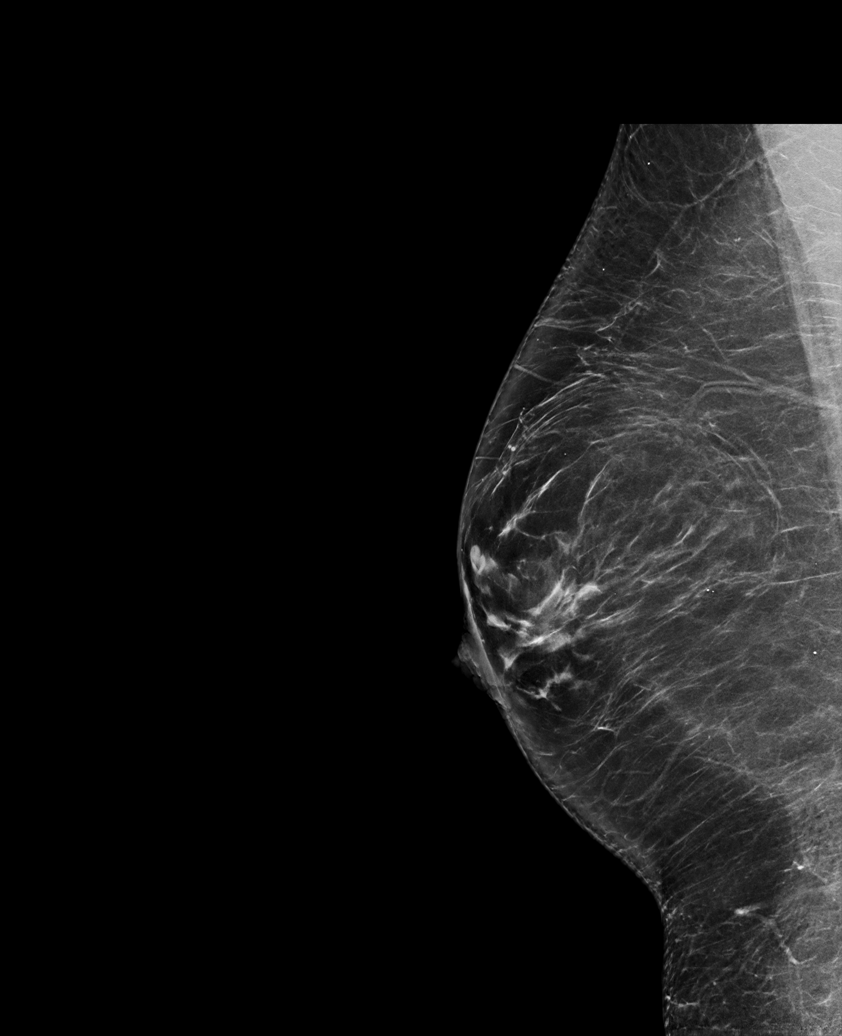

[L CC synth-2D]
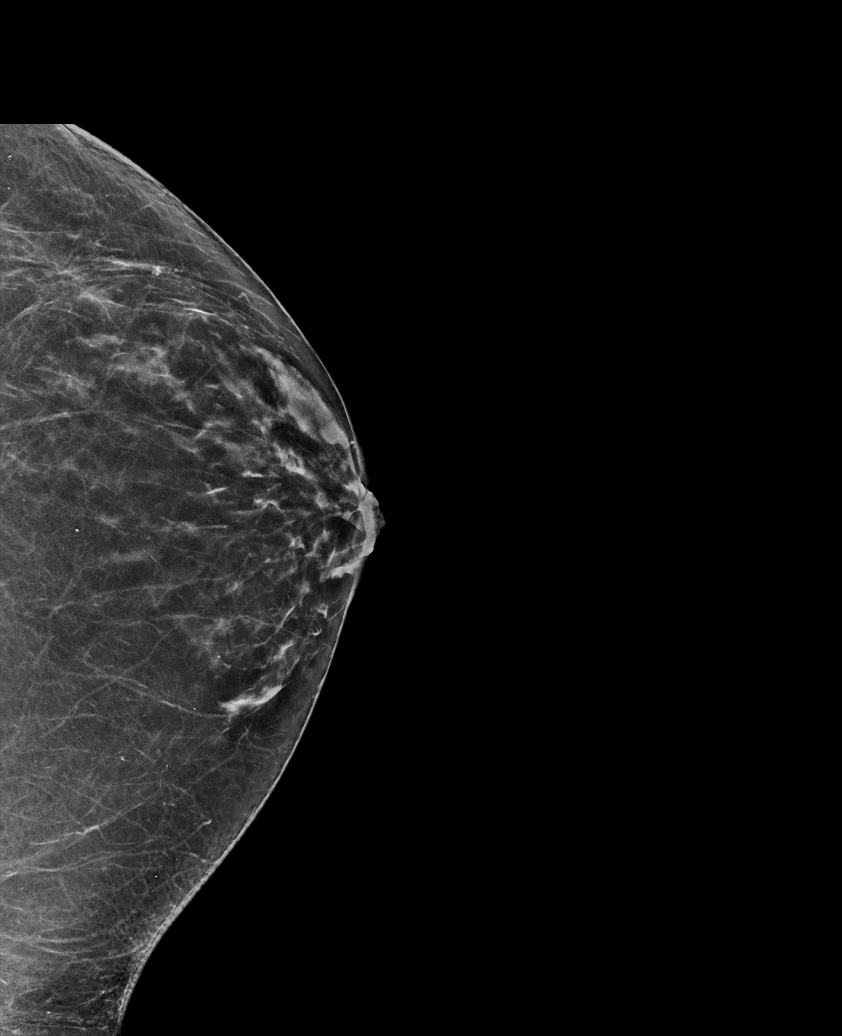

[R CC synth-2D]
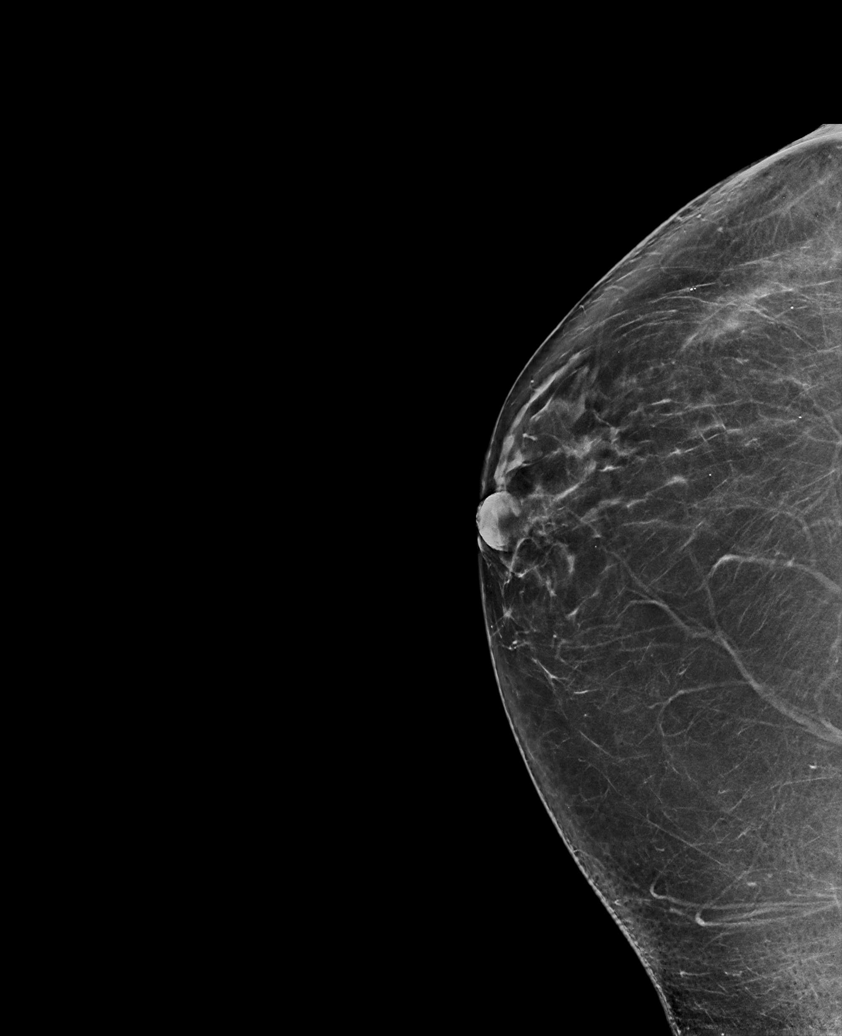

[L MLO synth-2D]
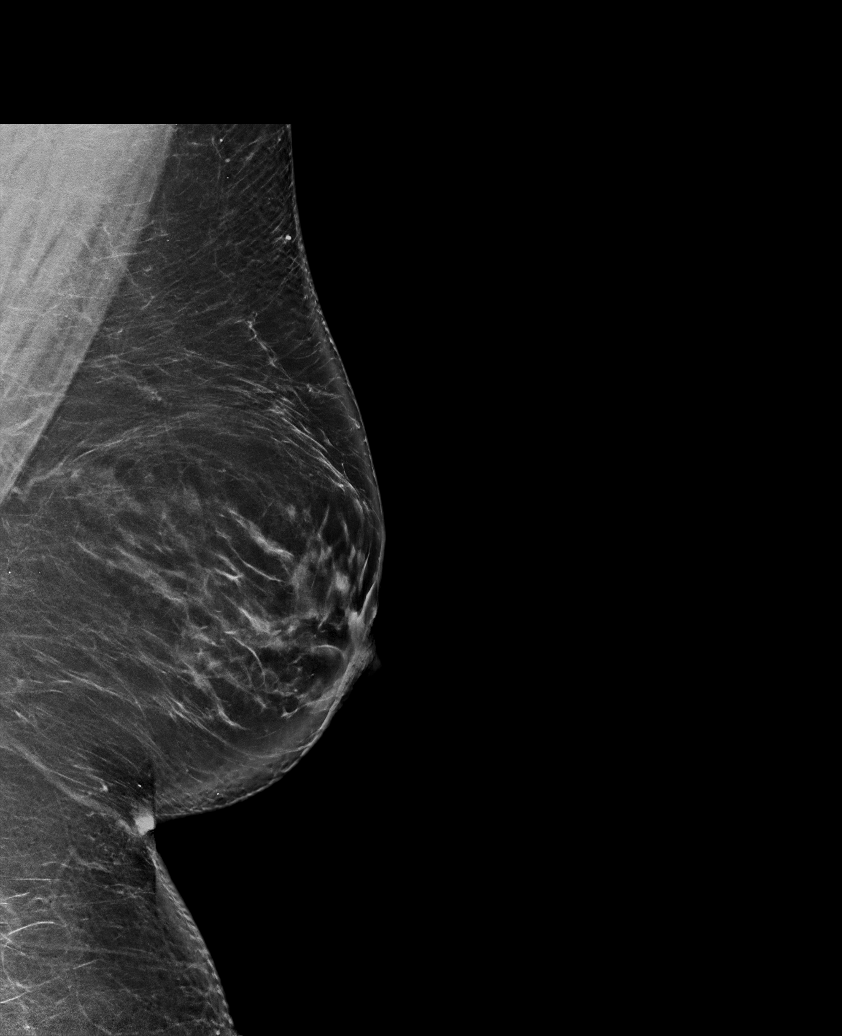

[R XCCL synth-2D]
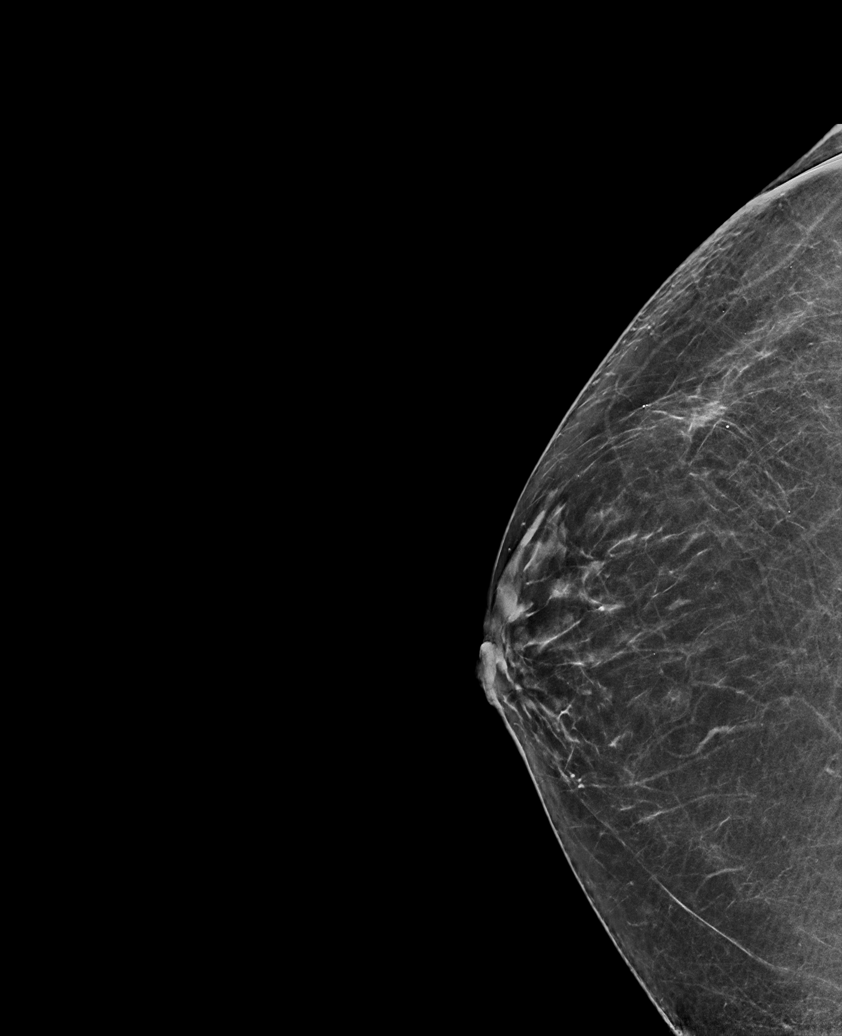

[R XCCL tomo · tomo slice 38/75.0]
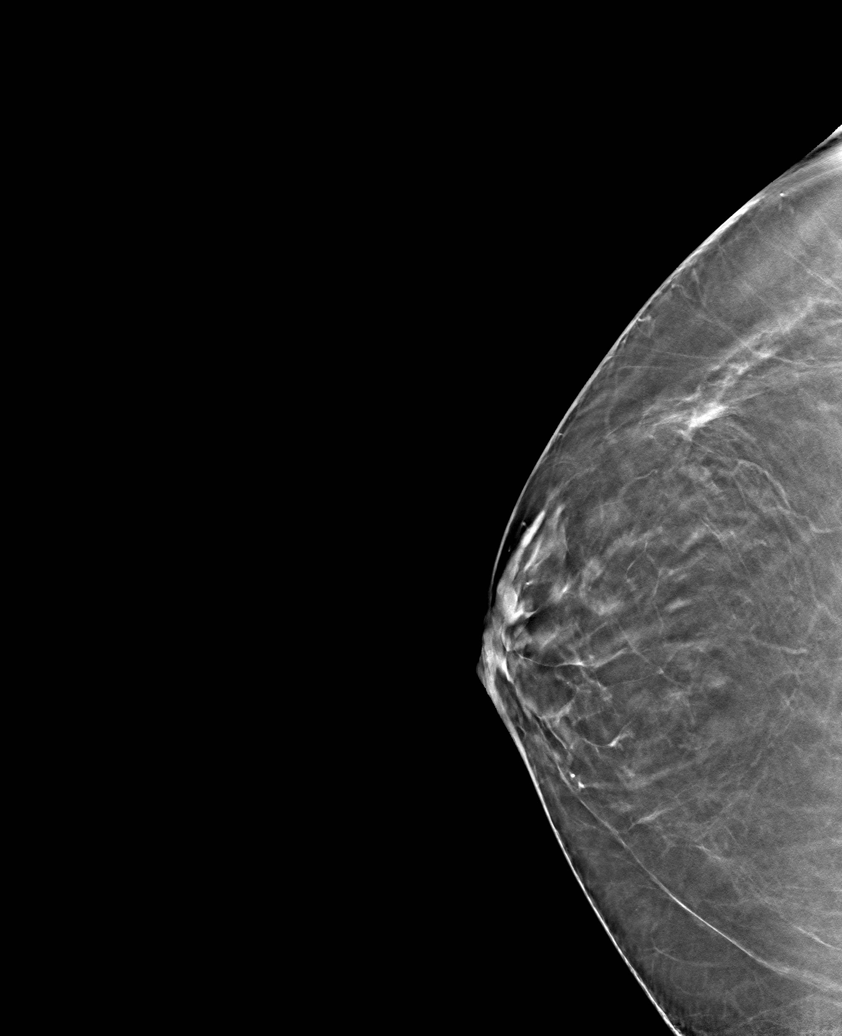

[6 of 30 positions shown; findings below may reference images not displayed]

ACR Breast Density Category b: There are scattered areas of
fibroglandular density.
FINDINGS: There are no findings suspicious for malignancy.
IMPRESSION: No mammographic evidence of malignancy. A result letter of this
screening mammogram will be mailed directly to the patient.

RECOMMENDATION:
Screening mammogram in one year. (Code:51-O-LD2)

BI-RADS CATEGORY  1: Negative.
